# Patient Record
Sex: Male | Born: 1960 | Race: White | Hispanic: No | Marital: Married | Smoking: Current every day smoker
Health system: Southern US, Community
[De-identification: ages and names within clinical notes are randomized; demographics above are authoritative.]

## PROBLEM LIST (undated history)

## (undated) DIAGNOSIS — I10 Essential (primary) hypertension: Secondary | ICD-10-CM

## (undated) DIAGNOSIS — M109 Gout, unspecified: Secondary | ICD-10-CM

## (undated) DIAGNOSIS — E785 Hyperlipidemia, unspecified: Secondary | ICD-10-CM

## (undated) DIAGNOSIS — K469 Unspecified abdominal hernia without obstruction or gangrene: Secondary | ICD-10-CM

## (undated) DIAGNOSIS — Z720 Tobacco use: Secondary | ICD-10-CM

## (undated) DIAGNOSIS — E669 Obesity, unspecified: Secondary | ICD-10-CM

## (undated) HISTORY — DX: Obesity, unspecified: E66.9

## (undated) HISTORY — DX: Gout, unspecified: M10.9

## (undated) HISTORY — DX: Hyperlipidemia, unspecified: E78.5

## (undated) HISTORY — DX: Essential (primary) hypertension: I10

## (undated) HISTORY — DX: Tobacco use: Z72.0

## (undated) HISTORY — DX: Unspecified abdominal hernia without obstruction or gangrene: K46.9

---

## 2010-11-19 ENCOUNTER — Encounter: Payer: Self-pay | Admitting: Nurse Practitioner

## 2010-11-19 DIAGNOSIS — M109 Gout, unspecified: Secondary | ICD-10-CM | POA: Insufficient documentation

## 2010-11-19 DIAGNOSIS — K4021 Bilateral inguinal hernia, without obstruction or gangrene, recurrent: Secondary | ICD-10-CM

## 2010-11-19 DIAGNOSIS — I10 Essential (primary) hypertension: Secondary | ICD-10-CM | POA: Insufficient documentation

## 2010-11-19 DIAGNOSIS — E785 Hyperlipidemia, unspecified: Secondary | ICD-10-CM | POA: Insufficient documentation

## 2011-01-09 ENCOUNTER — Other Ambulatory Visit: Payer: Self-pay | Admitting: Family Medicine

## 2011-01-09 DIAGNOSIS — R200 Anesthesia of skin: Secondary | ICD-10-CM

## 2011-01-14 ENCOUNTER — Ambulatory Visit (HOSPITAL_COMMUNITY)
Admission: RE | Admit: 2011-01-14 | Discharge: 2011-01-14 | Disposition: A | Discharge status: Home / Self Care | Payer: PRIVATE HEALTH INSURANCE | Source: Ambulatory Visit | Attending: Family Medicine | Admitting: Family Medicine

## 2011-01-14 ENCOUNTER — Other Ambulatory Visit: Payer: Self-pay | Admitting: Family Medicine

## 2011-01-14 DIAGNOSIS — R209 Unspecified disturbances of skin sensation: Secondary | ICD-10-CM | POA: Insufficient documentation

## 2011-01-14 DIAGNOSIS — R200 Anesthesia of skin: Secondary | ICD-10-CM

## 2011-01-14 DIAGNOSIS — M542 Cervicalgia: Secondary | ICD-10-CM | POA: Insufficient documentation

## 2011-01-14 DIAGNOSIS — M5020 Other cervical disc displacement, unspecified cervical region: Secondary | ICD-10-CM | POA: Insufficient documentation

## 2011-02-23 ENCOUNTER — Ambulatory Visit: Payer: PRIVATE HEALTH INSURANCE | Attending: Orthopedic Surgery | Admitting: Physical Therapy

## 2011-02-23 DIAGNOSIS — M546 Pain in thoracic spine: Secondary | ICD-10-CM | POA: Insufficient documentation

## 2011-02-23 DIAGNOSIS — R5381 Other malaise: Secondary | ICD-10-CM | POA: Insufficient documentation

## 2011-02-23 DIAGNOSIS — IMO0001 Reserved for inherently not codable concepts without codable children: Secondary | ICD-10-CM | POA: Insufficient documentation

## 2011-02-26 ENCOUNTER — Ambulatory Visit: Payer: PRIVATE HEALTH INSURANCE | Admitting: *Deleted

## 2011-03-03 ENCOUNTER — Ambulatory Visit: Payer: PRIVATE HEALTH INSURANCE | Attending: Orthopedic Surgery | Admitting: *Deleted

## 2011-03-03 DIAGNOSIS — R5381 Other malaise: Secondary | ICD-10-CM | POA: Insufficient documentation

## 2011-03-03 DIAGNOSIS — M546 Pain in thoracic spine: Secondary | ICD-10-CM | POA: Insufficient documentation

## 2011-03-03 DIAGNOSIS — IMO0001 Reserved for inherently not codable concepts without codable children: Secondary | ICD-10-CM | POA: Insufficient documentation

## 2011-03-05 ENCOUNTER — Encounter: Payer: PRIVATE HEALTH INSURANCE | Admitting: *Deleted

## 2013-03-13 ENCOUNTER — Ambulatory Visit (INDEPENDENT_AMBULATORY_CARE_PROVIDER_SITE_OTHER): Payer: No Typology Code available for payment source | Admitting: Family Medicine

## 2013-03-13 ENCOUNTER — Encounter: Payer: Self-pay | Admitting: Family Medicine

## 2013-03-13 VITALS — BP 123/79 | HR 58 | Temp 97.2°F | Ht 67.5 in | Wt 223.0 lb

## 2013-03-13 DIAGNOSIS — F17200 Nicotine dependence, unspecified, uncomplicated: Secondary | ICD-10-CM | POA: Insufficient documentation

## 2013-03-13 DIAGNOSIS — E669 Obesity, unspecified: Secondary | ICD-10-CM | POA: Insufficient documentation

## 2013-03-13 DIAGNOSIS — K4021 Bilateral inguinal hernia, without obstruction or gangrene, recurrent: Secondary | ICD-10-CM

## 2013-03-13 DIAGNOSIS — N529 Male erectile dysfunction, unspecified: Secondary | ICD-10-CM | POA: Insufficient documentation

## 2013-03-13 DIAGNOSIS — E785 Hyperlipidemia, unspecified: Secondary | ICD-10-CM

## 2013-03-13 DIAGNOSIS — M109 Gout, unspecified: Secondary | ICD-10-CM

## 2013-03-13 DIAGNOSIS — I10 Essential (primary) hypertension: Secondary | ICD-10-CM

## 2013-03-13 LAB — POCT CBC
Granulocyte percent: 71.1 %G (ref 37–80)
HCT, POC: 47.9 % (ref 43.5–53.7)
Hemoglobin: 16.1 g/dL (ref 14.1–18.1)
Lymph, poc: 2.7 (ref 0.6–3.4)
MCH, POC: 29.8 pg (ref 27–31.2)
MCHC: 33.6 g/dL (ref 31.8–35.4)
MCV: 88.6 fL (ref 80–97)
MPV: 9.9 fL (ref 0–99.8)
POC Granulocyte: 7.7 — AB (ref 2–6.9)
POC LYMPH PERCENT: 25 %L (ref 10–50)
Platelet Count, POC: 215 10*3/uL (ref 142–424)
RBC: 5.4 M/uL (ref 4.69–6.13)
RDW, POC: 13.3 %
WBC: 10.9 10*3/uL — AB (ref 4.6–10.2)

## 2013-03-13 MED ORDER — ALLOPURINOL 300 MG PO TABS: 300.0000 mg | ORAL_TABLET | Freq: Every day | ORAL | Status: DC

## 2013-03-13 MED ORDER — LISINOPRIL 10 MG PO TABS: 10.0000 mg | ORAL_TABLET | Freq: Every day | ORAL | Status: DC

## 2013-03-13 MED ORDER — AMLODIPINE BESYLATE 2.5 MG PO TABS: 2.5000 mg | ORAL_TABLET | Freq: Every day | ORAL | Status: DC

## 2013-03-13 MED ORDER — FENOFIBRATE 160 MG PO TABS: 160.0000 mg | ORAL_TABLET | Freq: Every day | ORAL | Status: DC

## 2013-03-13 NOTE — Patient Instructions (Addendum)
Dr Woodroe Mode Recommendations  Diet and Exercise discussed with patient.  For nutrition information, I recommend books:  1).Eat to Live by Dr Monico Hoar. 2).Prevent and Reverse Heart Disease by Dr Suzzette Righter. 3) Dr Katherina Right Book:  Program to Reverse Diabetes  Exercise recommendations are:  If unable to walk, then the patient can exercise in a chair 3 times a day. By flapping arms like a bird gently and raising legs outwards to the front.  If ambulatory, the patient can go for walks for 30 minutes 3 times a week. Then increase the intensity and duration as tolerated.  Goal is to try to attain exercise frequency to 5 times a week.   Smoking Cessation Quitting smoking is important to your health and has many advantages. However, it is not always easy to quit since nicotine is a very addictive drug. Often times, people try 3 times or more before being able to quit. This document explains the best ways for you to prepare to quit smoking. Quitting takes hard work and a lot of effort, but you can do it. ADVANTAGES OF QUITTING SMOKING  You will live longer, feel better, and live better.  Your body will feel the impact of quitting smoking almost immediately.  Within 20 minutes, blood pressure decreases. Your pulse returns to its normal level.  After 8 hours, carbon monoxide levels in the blood return to normal. Your oxygen level increases.  After 24 hours, the chance of having a heart attack starts to decrease. Your breath, hair, and body stop smelling like smoke.  After 48 hours, damaged nerve endings begin to recover. Your sense of taste and smell improve.  After 72 hours, the body is virtually free of nicotine. Your bronchial tubes relax and breathing becomes easier.  After 2 to 12 weeks, lungs can hold more air. Exercise becomes easier and circulation improves.  The risk of having a heart attack, stroke, cancer, or lung disease is greatly reduced.  After 1  year, the risk of coronary heart disease is cut in half.  After 5 years, the risk of stroke falls to the same as a nonsmoker.  After 10 years, the risk of lung cancer is cut in half and the risk of other cancers decreases significantly.  After 15 years, the risk of coronary heart disease drops, usually to the level of a nonsmoker.  If you are pregnant, quitting smoking will improve your chances of having a healthy baby.  The people you live with, especially any children, will be healthier.  You will have extra money to spend on things other than cigarettes. QUESTIONS TO THINK ABOUT BEFORE ATTEMPTING TO QUIT You may want to talk about your answers with your caregiver.  Why do you want to quit?  If you tried to quit in the past, what helped and what did not?  What will be the most difficult situations for you after you quit? How will you plan to handle them?  Who can help you through the tough times? Your family? Friends? A caregiver?  What pleasures do you get from smoking? What ways can you still get pleasure if you quit? Here are some questions to ask your caregiver:  How can you help me to be successful at quitting?  What medicine do you think would be best for me and how should I take it?  What should I do if I need more help?  What is smoking withdrawal like? How can I get  information on withdrawal? GET READY  Set a quit date.  Change your environment by getting rid of all cigarettes, ashtrays, matches, and lighters in your home, car, or work. Do not let people smoke in your home.  Review your past attempts to quit. Think about what worked and what did not. GET SUPPORT AND ENCOURAGEMENT You have a better chance of being successful if you have help. You can get support in many ways.  Tell your family, friends, and co-workers that you are going to quit and need their support. Ask them not to smoke around you.  Get individual, group, or telephone counseling and support.  Programs are available at [employer] and health centers. Call your local health department for information about programs in your area.  Spiritual beliefs and practices may help some smokers quit.  Download a "quit meter" on your computer to keep track of quit statistics, such as how long you have gone without smoking, cigarettes not smoked, and money saved.  Get a self-help book about quitting smoking and staying off of tobacco. LEARN NEW SKILLS AND BEHAVIORS  Distract yourself from urges to smoke. Talk to someone, go for a walk, or occupy your time with a task.  Change your normal routine. Take a different route to work. Drink tea instead of coffee. Eat breakfast in a different place.  Reduce your stress. Take a hot bath, exercise, or read a book.  Plan something enjoyable to do every day. Reward yourself for not smoking.  Explore interactive web-based programs that specialize in helping you quit. GET MEDICINE AND USE IT CORRECTLY Medicines can help you stop smoking and decrease the urge to smoke. Combining medicine with the above behavioral methods and support can greatly increase your chances of successfully quitting smoking.  Nicotine replacement therapy helps deliver nicotine to your body without the negative effects and risks of smoking. Nicotine replacement therapy includes nicotine gum, lozenges, inhalers, nasal sprays, and skin patches. Some may be available over-the-counter and others require a prescription.  Antidepressant medicine helps people abstain from smoking, but how this works is unknown. This medicine is available by prescription.  Nicotinic receptor partial agonist medicine simulates the effect of nicotine in your brain. This medicine is available by prescription. Ask your caregiver for advice about which medicines to use and how to use them based on your health history. Your caregiver will tell you what side effects to look out for if you choose to be on a  medicine or therapy. Carefully read the information on the package. Do not use any other product containing nicotine while using a nicotine replacement product.  RELAPSE OR DIFFICULT SITUATIONS Most relapses occur within the first 3 months after quitting. Do not be discouraged if you start smoking again. Remember, most people try several times before finally quitting. You may have symptoms of withdrawal because your body is used to nicotine. You may crave cigarettes, be irritable, feel very hungry, cough often, get headaches, or have difficulty concentrating. The withdrawal symptoms are only temporary. They are strongest when you first quit, but they will go away within 10 14 days. To reduce the chances of relapse, try to:  Avoid drinking alcohol. Drinking lowers your chances of successfully quitting.  Reduce the amount of caffeine you consume. Once you quit smoking, the amount of caffeine in your body increases and can give you symptoms, such as a rapid heartbeat, sweating, and anxiety.  Avoid smokers because they can make you want to smoke.  Do not  let weight gain distract you. Many smokers will gain weight when they quit, usually less than 10 pounds. Eat a healthy diet and stay active. You can always lose the weight gained after you quit.  Find ways to improve your mood other than smoking. FOR MORE INFORMATION  www.smokefree.gov  Document Released: 07/14/2001 Document Revised: 01/19/2012 Document Reviewed: 10/29/2011 [facility] Patient Information 2014 [place], [place].  If applicable: Best to perform resistance exercises (machines or weights) 2 days a week and cardio type exercises 3 days per week.

## 2013-03-13 NOTE — Progress Notes (Signed)
Patient ID: Edwin Taylor, male   DOB: 1961-05-08, 52 y.o.   MRN: [id number] SUBJECTIVE: CC: Chief Complaint  Patient presents with  . Follow-up    6 month refill fenofibrate had pepsi this am    HPI: Patient is here for follow up of hyperlipidemia: denies Headache;denies Chest Pain;denies weakness;denies Shortness of Breath and orthopnea;denies Visual changes;denies palpitations;denies cough;denies pedal edema;denies symptoms of TIA or stroke;deniesClaudication symptoms. admits to nonCompliance with medications;because of orthostatic symptoms Problems with medications: orthostatic symptoms.  Breakfast: usually none, on Weekend: sausage  Eggs and grits Lunch: usually none if breakfast, in the week : [employer] Supper: what ever wife cooks: sphaghetti, pork chops, fried chicken.  Erections: weaker but still able to perform. Drinks 6-8 beers on either a Saturday or Sunday. Smokes 1-1 1/2 PPD since age 30 years , not ready to stop smoking.  Medications costly wants to switch to less expensive. BP medications makes him dizzy. He really takes it every other day.   Past Medical History  Diagnosis Date  . Hypertension   . Hyperlipidemia   . Gout    History reviewed. No pertinent past surgical history. History   Social History  . Marital Status: Married    Spouse Name: N/A    Number of Children: N/A  . Years of Education: N/A   Occupational History  . Not on file.   Social History Main Topics  . Smoking status: Current Every Day Smoker -- 1.50 packs/day    Types: Cigarettes  . Smokeless tobacco: Not on file  . Alcohol Use: 4.8 oz/week    8 Cans of beer per week     Comment: 8 cans of beer on the weekend Saturday when off work call.  . Drug Use: No  . Sexually Active: Yes   Other Topics Concern  . Not on file   Social History Narrative  . No narrative on file   Family History  Problem Relation Age of Onset  . Diabetes Mother   . Arthritis Mother     knees  .  Cancer Father 30  . Birth defects Sister   . Diabetes Maternal Grandmother   . Dementia Maternal Grandmother   . Heart disease Maternal Grandfather   . Heart disease Paternal Grandmother    Current Outpatient Prescriptions on File Prior to Visit  Medication Sig Dispense Refill  . pravastatin (PRAVACHOL) 40 MG tablet Take 40 mg by mouth daily.        No current facility-administered medications on file prior to visit.   No Known Allergies Immunization History  Administered Date(s) Administered  . DTaP 12/10/2008   Prior to Admission medications   Medication Sig Start Date End Date Taking? Authorizing Provider  amLODipine (NORVASC) 2.5 MG tablet Take 1 tablet (2.5 mg total) by mouth daily. 03/13/13  Yes Ileana Ladd, MD  fenofibrate 160 MG tablet Take 1 tablet (160 mg total) by mouth daily. 03/13/13  Yes Ileana Ladd, MD  lisinopril (PRINIVIL,ZESTRIL) 10 MG tablet Take 1 tablet (10 mg total) by mouth daily. 03/13/13  Yes Ileana Ladd, MD  allopurinol (ZYLOPRIM) 300 MG tablet Take 1 tablet (300 mg total) by mouth daily. 03/13/13   Ileana Ladd, MD  pravastatin (PRAVACHOL) 40 MG tablet Take 40 mg by mouth daily.     Historical Provider, MD      ROS: As above in the HPI. All other systems are stable or negative.  OBJECTIVE: APPEARANCE:  Patient in no acute distress.The patient appeared  well nourished and normally developed. Acyanotic. Waist:41 inches VITAL SIGNS:BP 123/79  Pulse 58  Temp(Src) 97.2 F (36.2 C) (Oral)  Ht 5' 7.5" (1.715 m)  Wt 223 lb (101.152 kg)  BMI 34.39 kg/m2  Obese WM  SKIN: warm and  Dry without overt rashes, tattoos and scars  HEAD and Neck: without JVD, Head and scalp: normal Eyes:No scleral icterus. Fundi normal, eye movements normal. Ears: Auricle normal, canal normal, Tympanic membranes normal, insufflation normal. Nose: normal Throat: normal. Missing front upper teeth Neck & thyroid: normal  CHEST & LUNGS: Chest wall: normal Lungs:  Clear  CVS: Reveals the PMI to be normally located. Regular rhythm, First and Second Heart sounds are normal,  absence of murmurs, rubs or gallops. Peripheral vasculature: Radial pulses: normal Dorsal pedis pulses: normal Posterior pulses: normal  ABDOMEN:  Appearance: obese Benign, no organomegaly, no masses, no Abdominal Aortic enlargement. No Guarding , no rebound. No Bruits. Bowel sounds: normal  RECTAL: N/A GU: N/A  EXTREMETIES: nonedematous. Both Femoral and Pedal pulses are normal.  MUSCULOSKELETAL:  Spine: normal Joints: intact  NEUROLOGIC: oriented to time,place and person; nonfocal. Strength is normal Sensory is normal Reflexes are normal Cranial Nerves are normal.  ASSESSMENT: HTN (hypertension) - Plan: CMP14+EGFR, amLODipine (NORVASC) 2.5 MG tablet, lisinopril (PRINIVIL,ZESTRIL) 10 MG tablet  Hyperlipemia - Plan: CMP14+EGFR, NMR, lipoprofile, fenofibrate 160 MG tablet  Gout - Plan: CMP14+EGFR, Uric acid, allopurinol (ZYLOPRIM) 300 MG tablet  Inguinal hernia recurrent bilateral - asymptomatic  Erectile dysfunction - Plan: TSH, POCT CBC  Tobacco use disorder  Obesity, unspecified  Hypotensive symptoms with his present doses  PLAN:      Dr Woodroe Mode Recommendations  Diet and Exercise discussed with patient.  For nutrition information, I recommend books:  1).Eat to Live by Dr Monico Hoar. 2).Prevent and Reverse Heart Disease by Dr Suzzette Righter. 3) Dr Katherina Right Book:  Program to Reverse Diabetes  Exercise recommendations are:  If unable to walk, then the patient can exercise in a chair 3 times a day. By flapping arms like a bird gently and raising legs outwards to the front.  If ambulatory, the patient can go for walks for 30 minutes 3 times a week. Then increase the intensity and duration as tolerated.  Goal is to try to attain exercise frequency to 5 times a week.  If applicable: Best to perform resistance exercises  (machines or weights) 2 days a week and cardio type exercises 3 days per week.  Smoking cessation handouts in the AVS. Counseled for 6 minutes.  Orders Placed This Encounter  Procedures  . CMP14+EGFR  . NMR, lipoprofile  . Uric acid  . TSH  . POCT CBC    Meds ordered this encounter  Medications  . DISCONTD: ULORIC 40 MG tablet    Sig: Take 40 mg by mouth daily.   Marland Kitchen DISCONTD: fenofibrate 160 MG tablet    Sig: Take 160 mg by mouth daily.   Marland Kitchen DISCONTD: amLODipine (NORVASC) 5 MG tablet    Sig: Take 5 mg by mouth daily.   . fenofibrate 160 MG tablet    Sig: Take 1 tablet (160 mg total) by mouth daily.    Dispense:  30 tablet    Refill:  11  . allopurinol (ZYLOPRIM) 300 MG tablet    Sig: Take 1 tablet (300 mg total) by mouth daily.    Dispense:  30 tablet    Refill:  11  . amLODipine (NORVASC) 2.5 MG tablet    Sig:  Take 1 tablet (2.5 mg total) by mouth daily.    Dispense:  30 tablet    Refill:  11  . lisinopril (PRINIVIL,ZESTRIL) 10 MG tablet    Sig: Take 1 tablet (10 mg total) by mouth daily.    Dispense:  30 tablet    Refill:  11    Return in about 3 months (around 06/13/2013) for Recheck medical problems.  Claudius Mich P. Modesto Charon, M.D.

## 2013-03-15 LAB — NMR, LIPOPROFILE
Cholesterol: 196 mg/dL (ref <200)
HDL Cholesterol by NMR: 36 mg/dL — ABNORMAL LOW (ref >=40)
HDL Particle Number: 27.1 umol/L — ABNORMAL LOW (ref >=30.5)
LDL Particle Number: 1571 nmol/L — ABNORMAL HIGH (ref <1000)
LDL Size: 21.1 nm (ref >20.5)
LDLC SERPL CALC-MCNC: 116 mg/dL — ABNORMAL HIGH (ref <100)
LP-IR Score: 68 — ABNORMAL HIGH (ref <=45)
Small LDL Particle Number: 542 nmol/L — ABNORMAL HIGH (ref <=527)
Triglycerides by NMR: 220 mg/dL — ABNORMAL HIGH (ref <150)

## 2013-03-15 LAB — CMP14+EGFR
ALT: 28 IU/L (ref 0–44)
AST: 67 IU/L — ABNORMAL HIGH (ref 0–40)
Albumin/Globulin Ratio: 2.1 (ref 1.1–2.5)
Albumin: 4.8 g/dL (ref 3.5–5.5)
Alkaline Phosphatase: 41 IU/L (ref 39–117)
BUN/Creatinine Ratio: 7 — ABNORMAL LOW (ref 9–20)
BUN: 12 mg/dL (ref 6–24)
CO2: 26 mmol/L (ref 18–29)
Calcium: 9.8 mg/dL (ref 8.7–10.2)
Chloride: 103 mmol/L (ref 97–108)
Creatinine, Ser: 1.62 mg/dL — ABNORMAL HIGH (ref 0.76–1.27)
GFR calc Af Amer: 55 mL/min/{1.73_m2} — ABNORMAL LOW (ref >59)
GFR calc non Af Amer: 48 mL/min/{1.73_m2} — ABNORMAL LOW (ref >59)
Globulin, Total: 2.3 g/dL (ref 1.5–4.5)
Glucose: 109 mg/dL — ABNORMAL HIGH (ref 65–99)
Potassium: 5.4 mmol/L — ABNORMAL HIGH (ref 3.5–5.2)
Sodium: 142 mmol/L (ref 134–144)
Total Bilirubin: 0.3 mg/dL (ref 0.0–1.2)
Total Protein: 7.1 g/dL (ref 6.0–8.5)

## 2013-03-15 LAB — URIC ACID: Uric Acid: 5.3 mg/dL (ref 3.7–8.6)

## 2013-03-15 LAB — TSH: TSH: 3.73 u[IU]/mL (ref 0.450–4.500)

## 2013-03-21 NOTE — Progress Notes (Signed)
Quick Note:  Labs abnormal. Kidney functions getting worse. Potassium mildly elevated. Lipids not at goal. Need to make an appointment with the clinicaL Pharmacist to review labs and medications and make recommendations on adjustments, dietary counseling. Thanks FW ______

## 2013-04-05 ENCOUNTER — Telehealth: Payer: Self-pay | Admitting: Pharmacist

## 2013-04-05 ENCOUNTER — Ambulatory Visit (INDEPENDENT_AMBULATORY_CARE_PROVIDER_SITE_OTHER): Payer: No Typology Code available for payment source | Admitting: Pharmacist

## 2013-04-05 VITALS — BP 120/80 | HR 68 | Ht 67.5 in | Wt 225.0 lb

## 2013-04-05 DIAGNOSIS — R799 Abnormal finding of blood chemistry, unspecified: Secondary | ICD-10-CM

## 2013-04-05 DIAGNOSIS — E785 Hyperlipidemia, unspecified: Secondary | ICD-10-CM

## 2013-04-05 DIAGNOSIS — R7989 Other specified abnormal findings of blood chemistry: Secondary | ICD-10-CM

## 2013-04-05 DIAGNOSIS — I10 Essential (primary) hypertension: Secondary | ICD-10-CM

## 2013-04-05 MED ORDER — ATORVASTATIN CALCIUM 40 MG PO TABS: 40.0000 mg | ORAL_TABLET | Freq: Every day | ORAL | Status: DC

## 2013-04-05 MED ORDER — AMLODIPINE BESYLATE 5 MG PO TABS: 5.0000 mg | ORAL_TABLET | Freq: Every day | ORAL | Status: DC

## 2013-04-06 LAB — CMP14+EGFR
ALT: 16 IU/L (ref 0–44)
Albumin/Globulin Ratio: 2.2 (ref 1.1–2.5)
GFR calc Af Amer: 57 mL/min/{1.73_m2} — ABNORMAL LOW (ref >59)
GFR calc non Af Amer: 50 mL/min/{1.73_m2} — ABNORMAL LOW (ref >59)
Globulin, Total: 2.1 g/dL (ref 1.5–4.5)
Glucose: 88 mg/dL (ref 65–99)
Potassium: 4.6 mmol/L (ref 3.5–5.2)
Total Bilirubin: 0.3 mg/dL (ref 0.0–1.2)
Total Protein: 6.8 g/dL (ref 6.0–8.5)

## 2013-04-06 NOTE — Progress Notes (Signed)
Lipid Clinic Consultation  Chief Complaint:   Chief Complaint  Patient presents with  . Hyperlipidemia  . Hypertension     Exam  edema:  negative Respirations:  normal   Carotid Bruits:  negative Xanthomas:  negative General Appearance:  alert, oriented, no acute distress and obese Mood/Affect:  normal  HPI:  First visit to lipid clinic.  Patient admits to being non compliant with cholesterol medications.  Only taking pravastatin 40mg  every 2-3 days and has not been taking fenofibrate.  He recently has elevated serum creatinine and uloric was discontinued to see if this would improve creatinine.  LABS: Serum creatinine = 1.62 GFR = 55 LDL-P = 1571 LDL-C = 116 HDL = 36 Tg = 161 K+ was 5.4 (slightly elevated) UA was WNL TSH was WNL     Component Value Date/Time   CHOL 196 03/13/2013 0912    CHD/CHF Risk Equivalents:  none  NCEP Risk Factors Present:  smoker, HTN, family history, low HDL and age Primary Problem(s):  LDL or LDL-P elevated, HDL or HDL-P decreased and TG elevated  Current NCEP Goals: LDL Goal < 100 HDL Goal >/= 40 Tg Goal < 096 Non-HDL Goal < 130  Secondary cause of hyperlipidemia present:  Poor diet and non compliance Low fat diet followed?  No - eats fast food daily at lunch Low carb diet followed?  No - regular Pepsi daily Exercise?  No -    Recommendations: 1.  Discussed diet at length - patient is not very open to change but I expressed concern of developing type 2 DM in the future and he seems open to making some changes such as limiting portion sizes and increasing vegetables.  I did strongly recommend to stop intake of soda! 2.  Discontinue pravastatin, tricor and lisinopril (may elevated creatinine)      Increase amolodipine back to 5mg  daily      Start atorvastatin 40mg  daily 3.  Encouraged daily exercise and smoking cessation 4.   Orders Placed This Encounter  Procedures  . CMP14+EGFR     Time spent counseling patient:  45  minutes

## 2013-04-10 ENCOUNTER — Telehealth: Payer: Self-pay | Admitting: Pharmacist

## 2013-04-10 NOTE — Telephone Encounter (Signed)
Serum creatinine and LFTs are still but improving.  Continue with medication changes discussed at appt last week . Recommended patient have repeat labs in 1 month but due to financial situation he would like to wait until appt in November with Dr Modesto Charon - ok to wait.

## 2013-04-10 NOTE — Telephone Encounter (Signed)
Pt notified of lab results

## 2013-04-18 ENCOUNTER — Encounter: Payer: Self-pay | Admitting: *Deleted

## 2013-06-20 ENCOUNTER — Ambulatory Visit: Payer: No Typology Code available for payment source | Admitting: Family Medicine

## 2013-06-26 ENCOUNTER — Ambulatory Visit (INDEPENDENT_AMBULATORY_CARE_PROVIDER_SITE_OTHER): Payer: No Typology Code available for payment source | Admitting: Family Medicine

## 2013-06-26 ENCOUNTER — Encounter: Payer: Self-pay | Admitting: Family Medicine

## 2013-06-26 VITALS — BP 127/77 | HR 76 | Temp 97.8°F | Ht 67.5 in | Wt 216.2 lb

## 2013-06-26 DIAGNOSIS — N529 Male erectile dysfunction, unspecified: Secondary | ICD-10-CM

## 2013-06-26 DIAGNOSIS — E785 Hyperlipidemia, unspecified: Secondary | ICD-10-CM

## 2013-06-26 DIAGNOSIS — K4021 Bilateral inguinal hernia, without obstruction or gangrene, recurrent: Secondary | ICD-10-CM

## 2013-06-26 DIAGNOSIS — M109 Gout, unspecified: Secondary | ICD-10-CM

## 2013-06-26 DIAGNOSIS — E669 Obesity, unspecified: Secondary | ICD-10-CM

## 2013-06-26 DIAGNOSIS — I10 Essential (primary) hypertension: Secondary | ICD-10-CM

## 2013-06-26 DIAGNOSIS — F17200 Nicotine dependence, unspecified, uncomplicated: Secondary | ICD-10-CM

## 2013-06-26 NOTE — Progress Notes (Signed)
  Subjective:    Patient ID: Edwin Taylor, male    DOB: 1960/08/06, 52 y.o.   MRN: [id number]  HPI 3 month follow up         Patient Active Problem List   Diagnosis Date Noted  . Tobacco use disorder 03/13/2013  . Obesity, unspecified 03/13/2013  . Erectile dysfunction 03/13/2013  . Gout 11/19/2010  . HTN (hypertension) 11/19/2010  . Hyperlipemia 11/19/2010  . Inguinal hernia recurrent bilateral 11/19/2010   Outpatient Encounter Prescriptions as of 06/26/2013  Medication Sig  . allopurinol (ZYLOPRIM) 300 MG tablet Take 1 tablet (300 mg total) by mouth daily.  Marland Kitchen amLODipine (NORVASC) 5 MG tablet Take 1 tablet (5 mg total) by mouth daily.  Marland Kitchen atorvastatin (LIPITOR) 40 MG tablet Take 1 tablet (40 mg total) by mouth daily.  Marland Kitchen lisinopril (PRINIVIL,ZESTRIL) 10 MG tablet      Review of Systems     Objective:   Physical Exam Blood pressure 127/77, pulse 76, temperature 97.8 F (36.6 C), temperature source Oral, height 5' 7.5" (1.715 m), weight 216 lb 3.2 oz (98.068 kg). s        Assessment & Plan:

## 2013-06-26 NOTE — Patient Instructions (Addendum)
Smoking Cessation Quitting smoking is important to your health and has many advantages. However, it is not always easy to quit since nicotine is a very addictive drug. Often times, people try 3 times or more before being able to quit. This document explains the best ways for you to prepare to quit smoking. Quitting takes hard work and a lot of effort, but you can do it. ADVANTAGES OF QUITTING SMOKING  You will live longer, feel better, and live better.  Your body will feel the impact of quitting smoking almost immediately.  Within 20 minutes, blood pressure decreases. Your pulse returns to its normal level.  After 8 hours, carbon monoxide levels in the blood return to normal. Your oxygen level increases.  After 24 hours, the chance of having a heart attack starts to decrease. Your breath, hair, and body stop smelling like smoke.  After 48 hours, damaged nerve endings begin to recover. Your sense of taste and smell improve.  After 72 hours, the body is virtually free of nicotine. Your bronchial tubes relax and breathing becomes easier.  After 2 to 12 weeks, lungs can hold more air. Exercise becomes easier and circulation improves.  The risk of having a heart attack, stroke, cancer, or lung disease is greatly reduced.  After 1 year, the risk of coronary heart disease is cut in half.  After 5 years, the risk of stroke falls to the same as a nonsmoker.  After 10 years, the risk of lung cancer is cut in half and the risk of other cancers decreases significantly.  After 15 years, the risk of coronary heart disease drops, usually to the level of a nonsmoker.  If you are pregnant, quitting smoking will improve your chances of having a healthy baby.  The people you live with, especially any children, will be healthier.  You will have extra money to spend on things other than cigarettes. QUESTIONS TO THINK ABOUT BEFORE ATTEMPTING TO QUIT You may want to talk about your answers with your  caregiver.  Why do you want to quit?  If you tried to quit in the past, what helped and what did not?  What will be the most difficult situations for you after you quit? How will you plan to handle them?  Who can help you through the tough times? Your family? Friends? A caregiver?  What pleasures do you get from smoking? What ways can you still get pleasure if you quit? Here are some questions to ask your caregiver:  How can you help me to be successful at quitting?  What medicine do you think would be best for me and how should I take it?  What should I do if I need more help?  What is smoking withdrawal like? How can I get information on withdrawal? GET READY  Set a quit date.  Change your environment by getting rid of all cigarettes, ashtrays, matches, and lighters in your home, car, or work. Do not let people smoke in your home.  Review your past attempts to quit. Think about what worked and what did not. GET SUPPORT AND ENCOURAGEMENT You have a better chance of being successful if you have help. You can get support in many ways.  Tell your family, friends, and co-workers that you are going to quit and need their support. Ask them not to smoke around you.  Get individual, group, or telephone counseling and support. Programs are available at local hospitals and health centers. Call your local health department for   information about programs in your area.  Spiritual beliefs and practices may help some smokers quit.  Download a "quit meter" on your computer to keep track of quit statistics, such as how long you have gone without smoking, cigarettes not smoked, and money saved.  Get a self-help book about quitting smoking and staying off of tobacco. LEARN NEW SKILLS AND BEHAVIORS  Distract yourself from urges to smoke. Talk to someone, go for a walk, or occupy your time with a task.  Change your normal routine. Take a different route to work. Drink tea instead of coffee.  Eat breakfast in a different place.  Reduce your stress. Take a hot bath, exercise, or read a book.  Plan something enjoyable to do every day. Reward yourself for not smoking.  Explore interactive web-based programs that specialize in helping you quit. GET MEDICINE AND USE IT CORRECTLY Medicines can help you stop smoking and decrease the urge to smoke. Combining medicine with the above behavioral methods and support can greatly increase your chances of successfully quitting smoking.  Nicotine replacement therapy helps deliver nicotine to your body without the negative effects and risks of smoking. Nicotine replacement therapy includes nicotine gum, lozenges, inhalers, nasal sprays, and skin patches. Some may be available over-the-counter and others require a prescription.  Antidepressant medicine helps people abstain from smoking, but how this works is unknown. This medicine is available by prescription.  Nicotinic receptor partial agonist medicine simulates the effect of nicotine in your brain. This medicine is available by prescription. Ask your caregiver for advice about which medicines to use and how to use them based on your health history. Your caregiver will tell you what side effects to look out for if you choose to be on a medicine or therapy. Carefully read the information on the package. Do not use any other product containing nicotine while using a nicotine replacement product.  RELAPSE OR DIFFICULT SITUATIONS Most relapses occur within the first 3 months after quitting. Do not be discouraged if you start smoking again. Remember, most people try several times before finally quitting. You may have symptoms of withdrawal because your body is used to nicotine. You may crave cigarettes, be irritable, feel very hungry, cough often, get headaches, or have difficulty concentrating. The withdrawal symptoms are only temporary. They are strongest when you first quit, but they will go away within  10 14 days. To reduce the chances of relapse, try to:  Avoid drinking alcohol. Drinking lowers your chances of successfully quitting.  Reduce the amount of caffeine you consume. Once you quit smoking, the amount of caffeine in your body increases and can give you symptoms, such as a rapid heartbeat, sweating, and anxiety.  Avoid smokers because they can make you want to smoke.  Do not let weight gain distract you. Many smokers will gain weight when they quit, usually less than 10 pounds. Eat a healthy diet and stay active. You can always lose the weight gained after you quit.  Find ways to improve your mood other than smoking. FOR MORE INFORMATION  www.smokefree.gov  Document Released: 07/14/2001 Document Revised: 01/19/2012 Document Reviewed: 10/29/2011 [employer] Patient Information 2014 [employer].        Dr Adalynn Corne's Recommendations  For nutrition information, I recommend books:  1).Eat to Live by Dr Joel Fuhrman. 2).Prevent and Reverse Heart Disease by Dr Caldwell Esselstyn. 3) Dr Neal Barnard's Book:  Program to Reverse Diabetes  Exercise recommendations are:  If unable to walk, then the patient can   exercise in a chair 3 times a day. By flapping arms like a bird gently and raising legs outwards to the front.  If ambulatory, the patient can go for walks for 30 minutes 3 times a week. Then increase the intensity and duration as tolerated.  Goal is to try to attain exercise frequency to 5 times a week.  If applicable: Best to perform resistance exercises (machines or weights) 2 days a week and cardio type exercises 3 days per week.   Take 81 mg aspirin daily enteric  Coated.

## 2013-06-26 NOTE — Progress Notes (Signed)
Patient ID: Edwin Taylor, male   DOB: Jan 22, 1961, 52 y.o.   MRN: [id number] SUBJECTIVE: CC: Chief Complaint  Patient presents with  . Follow-up    3 month     HPI: Edwin Taylor is here for follow up. He continues to smoke but it is less. He is reducing his sugary drinks. Patient is here for follow up of hyperlipidemia/HTN/Tobacco use: denies Headache;denies Chest Pain;denies weakness;denies Shortness of Breath and orthopnea;denies Visual changes;denies palpitations;denies cough;denies pedal edema;denies symptoms of TIA or stroke;deniesClaudication symptoms. admits to Compliance with medications; denies Problems with medications.   Past Medical History  Diagnosis Date  . Hypertension   . Hyperlipidemia   . Gout   . Obesity   . Hernia     bilateral inguinal  . Tobacco use    No past surgical history on file. History   Social History  . Marital Status: Married    Spouse Name: Edwin Taylor    Number of Children: Edwin Taylor  . Years of Education: Edwin Taylor   Occupational History  . Not on file.   Social History Main Topics  . Smoking status: Current Every Day Smoker -- 1.50 packs/day    Types: Cigarettes  . Smokeless tobacco: Not on file  . Alcohol Use: 4.8 oz/week    8 Cans of beer per week     Comment: 8 cans of beer on the weekend Saturday when off work call.  . Drug Use: No  . Sexual Activity: Yes   Other Topics Concern  . Not on file   Social History Narrative  . No narrative on file   Family History  Problem Relation Age of Onset  . Diabetes Mother   . Arthritis Mother     knees  . Cancer Father 52  . Birth defects Sister   . Diabetes Maternal Grandmother   . Dementia Maternal Grandmother   . Heart disease Maternal Grandfather   . Heart disease Paternal Grandmother    Current Outpatient Prescriptions on File Prior to Visit  Medication Sig Dispense Refill  . allopurinol (ZYLOPRIM) 300 MG tablet Take 1 tablet (300 mg total) by mouth daily.  30 tablet  11  . amLODipine  (NORVASC) 5 MG tablet Take 1 tablet (5 mg total) by mouth daily.  90 tablet  3  . atorvastatin (LIPITOR) 40 MG tablet Take 1 tablet (40 mg total) by mouth daily.  90 tablet  3   No current facility-administered medications on file prior to visit.   No Known Allergies Immunization History  Administered Date(s) Administered  . DTaP 12/10/2008   Prior to Admission medications   Medication Sig Start Date End Date Taking? Authorizing Provider  allopurinol (ZYLOPRIM) 300 MG tablet Take 1 tablet (300 mg total) by mouth daily. 03/13/13  Yes Ileana Ladd, MD  amLODipine (NORVASC) 5 MG tablet Take 1 tablet (5 mg total) by mouth daily. 04/05/13  Yes Tammy Eckard, PHARMD  atorvastatin (LIPITOR) 40 MG tablet Take 1 tablet (40 mg total) by mouth daily. 04/05/13  Yes Tammy Eckard, PHARMD  lisinopril (PRINIVIL,ZESTRIL) 10 MG tablet  06/05/13  Yes Historical Provider, MD     ROS: As above in the HPI. All other systems are stable or negative.  OBJECTIVE: APPEARANCE:  Patient in no acute distress.The patient appeared well nourished and normally developed. Acyanotic. Waist: VITAL SIGNS:BP 127/77  Pulse 76  Temp(Src) 97.8 F (36.6 C) (Oral)  Ht 5' 7.5" (1.715 m)  Wt 216 lb 3.2 oz (98.068 kg)  BMI 33.34 kg/m2  WM obese SKIN: warm and  Dry without overt rashes, tattoos and scars  HEAD and Neck: without JVD, Head and scalp: normal Eyes:No scleral icterus. Fundi normal, eye movements normal. Ears: Auricle normal, canal normal, Tympanic membranes normal, insufflation normal. Nose: normal Throat: normal Neck & thyroid: normal  CHEST & LUNGS: Chest wall: normal Lungs: Clear  CVS: Reveals the PMI to be normally located. Regular rhythm, First and Second Heart sounds are normal,  absence of murmurs, rubs or gallops. Peripheral vasculature: Radial pulses: normal Dorsal pedis pulses: normal Posterior pulses: normal  ABDOMEN:  Appearance: Obese Benign, no organomegaly, no masses, no Abdominal  Aortic enlargement. No Guarding , no rebound. No Bruits. Bowel sounds: normal  RECTAL: Edwin Taylor GU: Edwin Taylor  EXTREMETIES: nonedematous.  MUSCULOSKELETAL:  Spine: normal Joints: intact  NEUROLOGIC: oriented to time,place and person; nonfocal. Strength is normal Sensory is normal Reflexes are normal Cranial Nerves are normal.  ASSESSMENT:  HTN (hypertension) - Plan: CMP14+EGFR  Hyperlipemia - Plan: CMP14+EGFR, NMR, lipoprofile  Obesity, unspecified  Tobacco use disorder  Gout  Erectile dysfunction  Inguinal hernia recurrent bilateral  PLAN:      Dr Woodroe Mode Recommendations  For nutrition information, I recommend books:  1).Eat to Live by Dr Monico Hoar. 2).Prevent and Reverse Heart Disease by Dr Suzzette Righter. 3) Dr Katherina Right Book:  Program to Reverse Diabetes  Exercise recommendations are:  If unable to walk, then the patient can exercise in a chair 3 times a day. By flapping arms like a bird gently and raising legs outwards to the front.  If ambulatory, the patient can go for walks for 30 minutes 3 times a week. Then increase the intensity and duration as tolerated.  Goal is to try to attain exercise frequency to 5 times a week.  If applicable: Best to perform resistance exercises (machines or weights) 2 days a week and cardio type exercises 3 days per week.  Smoking cessation. Counselled. And handout in the AVS. Orders Placed This Encounter  Procedures  . CMP14+EGFR  . NMR, lipoprofile   Meds ordered this encounter  Medications  . lisinopril (PRINIVIL,ZESTRIL) 10 MG tablet    Sig:    counselled on diet and exercise and weight loss.   There are no discontinued medications. Return in about 4 months (around 10/24/2013) for Recheck medical problems.  Kaylyne Axton P. Modesto Charon, M.D.

## 2013-06-27 LAB — CMP14+EGFR
ALT: 17 IU/L (ref 0–44)
AST: 28 IU/L (ref 0–40)
Albumin/Globulin Ratio: 2.2 (ref 1.1–2.5)
Albumin: 4.8 g/dL (ref 3.5–5.5)
Alkaline Phosphatase: 88 IU/L (ref 39–117)
BUN/Creatinine Ratio: 11 (ref 9–20)
BUN: 13 mg/dL (ref 6–24)
CO2: 26 mmol/L (ref 18–29)
Calcium: 9.8 mg/dL (ref 8.7–10.2)
Chloride: 100 mmol/L (ref 97–108)
Creatinine, Ser: 1.22 mg/dL (ref 0.76–1.27)
GFR calc Af Amer: 78 mL/min/{1.73_m2} (ref >59)
GFR calc non Af Amer: 67 mL/min/{1.73_m2} (ref >59)
Globulin, Total: 2.2 g/dL (ref 1.5–4.5)
Glucose: 96 mg/dL (ref 65–99)
Potassium: 4.7 mmol/L (ref 3.5–5.2)
Sodium: 142 mmol/L (ref 134–144)
Total Bilirubin: 0.4 mg/dL (ref 0.0–1.2)
Total Protein: 7 g/dL (ref 6.0–8.5)

## 2013-06-27 LAB — NMR, LIPOPROFILE
Cholesterol: 144 mg/dL (ref <200)
HDL Cholesterol by NMR: 38 mg/dL — ABNORMAL LOW (ref >=40)
HDL Particle Number: 30.1 umol/L — ABNORMAL LOW (ref >=30.5)
LDL Particle Number: 1096 nmol/L — ABNORMAL HIGH (ref <1000)
LDL Size: 20.4 nm — ABNORMAL LOW (ref >20.5)
LDLC SERPL CALC-MCNC: 72 mg/dL (ref <100)
LP-IR Score: 58 — ABNORMAL HIGH (ref <=45)
Small LDL Particle Number: 625 nmol/L — ABNORMAL HIGH (ref <=527)
Triglycerides by NMR: 172 mg/dL — ABNORMAL HIGH (ref <150)

## 2013-06-27 LAB — SPECIMEN STATUS REPORT

## 2013-07-17 ENCOUNTER — Telehealth: Payer: Self-pay | Admitting: *Deleted

## 2013-07-20 NOTE — Telephone Encounter (Signed)
Notified and verbalized understanding.

## 2013-09-05 ENCOUNTER — Encounter: Payer: Self-pay | Admitting: Family Medicine

## 2013-09-05 ENCOUNTER — Ambulatory Visit (INDEPENDENT_AMBULATORY_CARE_PROVIDER_SITE_OTHER): Payer: No Typology Code available for payment source | Admitting: Family Medicine

## 2013-09-05 VITALS — BP 164/88 | HR 70 | Temp 98.6°F | Ht 67.5 in | Wt 222.0 lb

## 2013-09-05 DIAGNOSIS — R059 Cough, unspecified: Secondary | ICD-10-CM

## 2013-09-05 DIAGNOSIS — R05 Cough: Secondary | ICD-10-CM

## 2013-09-05 DIAGNOSIS — J029 Acute pharyngitis, unspecified: Secondary | ICD-10-CM

## 2013-09-05 DIAGNOSIS — R509 Fever, unspecified: Secondary | ICD-10-CM

## 2013-09-05 LAB — POCT RAPID STREP A (OFFICE): Rapid Strep A Screen: NEGATIVE

## 2013-09-05 LAB — POCT INFLUENZA A/B
Influenza A, POC: NEGATIVE
Influenza B, POC: NEGATIVE

## 2013-09-05 MED ORDER — AMOXICILLIN 875 MG PO TABS: 875.0000 mg | ORAL_TABLET | Freq: Two times a day (BID) | ORAL | Status: DC

## 2013-09-05 MED ORDER — HYDROCODONE-HOMATROPINE 5-1.5 MG/5ML PO SYRP
5.0000 mL | ORAL_SOLUTION | Freq: Three times a day (TID) | ORAL | Status: DC | PRN
Start: 2013-09-05 — End: 2014-08-30

## 2013-09-05 NOTE — Patient Instructions (Signed)

## 2013-09-05 NOTE — Progress Notes (Signed)
   Subjective:    Patient ID: Edwin Taylor, male    DOB: 12-25-1960, 53 y.o.   MRN: [id number]  HPI This 53 y.o. male presents for evaluation of uri and cough.  He has had this for 4 days.   Review of Systems    No chest pain, SOB, HA, dizziness, vision change, N/V, diarrhea, constipation, dysuria, urinary urgency or frequency, myalgias, arthralgias or rash.  Objective:   Physical Exam  Vital signs noted  Well developed well nourished male.  HEENT - Head atraumatic Normocephalic                Eyes - PERRLA, Conjuctiva - clear Sclera- Clear EOMI                Ears - EAC's Wnl TM's Wnl Gross Hearing WNL                Nose - Nares patent                 Throat - oropharanx wnl Respiratory - Lungs CTA bilateral Cardiac - RRR S1 and S2 without murmur GI - Abdomen soft Nontender and bowel sounds active x 4       Assessment & Plan:  Cough - Plan: POCT rapid strep A, POCT Influenza A/B, HYDROcodone-homatropine (HYCODAN) 5-1.5 MG/5ML syrup, amoxicillin (AMOXIL) 875 MG tablet  Sore throat - Plan: POCT rapid strep A, POCT Influenza A/B, HYDROcodone-homatropine (HYCODAN) 5-1.5 MG/5ML syrup, amoxicillin (AMOXIL) 875 MG tablet  Fever - Plan: POCT rapid strep A, POCT Influenza A/B, HYDROcodone-homatropine (HYCODAN) 5-1.5 MG/5ML syrup, amoxicillin (AMOXIL) 875 MG tablet  Push po fluids, rest, tylenol and motrin otc prn as directed for fever, arthralgias, and myalgias.  Follow up prn if sx's continue or persist.   Deatra CanterWilliam J Oxford FNP

## 2013-09-28 ENCOUNTER — Ambulatory Visit: Payer: No Typology Code available for payment source | Admitting: Family Medicine

## 2013-10-16 ENCOUNTER — Ambulatory Visit (INDEPENDENT_AMBULATORY_CARE_PROVIDER_SITE_OTHER): Payer: No Typology Code available for payment source | Admitting: Family Medicine

## 2013-10-16 ENCOUNTER — Encounter: Payer: Self-pay | Admitting: Family Medicine

## 2013-10-16 VITALS — BP 123/83 | HR 60 | Temp 97.5°F | Ht 67.0 in | Wt 216.0 lb

## 2013-10-16 DIAGNOSIS — E669 Obesity, unspecified: Secondary | ICD-10-CM

## 2013-10-16 DIAGNOSIS — N529 Male erectile dysfunction, unspecified: Secondary | ICD-10-CM

## 2013-10-16 DIAGNOSIS — E785 Hyperlipidemia, unspecified: Secondary | ICD-10-CM

## 2013-10-16 DIAGNOSIS — M109 Gout, unspecified: Secondary | ICD-10-CM

## 2013-10-16 DIAGNOSIS — K4021 Bilateral inguinal hernia, without obstruction or gangrene, recurrent: Secondary | ICD-10-CM

## 2013-10-16 DIAGNOSIS — F17200 Nicotine dependence, unspecified, uncomplicated: Secondary | ICD-10-CM

## 2013-10-16 DIAGNOSIS — I10 Essential (primary) hypertension: Secondary | ICD-10-CM

## 2013-10-16 NOTE — Progress Notes (Signed)
Patient ID: Edwin Taylor, male   DOB: [birth year], 53 y.o.   MRN: [id number] SUBJECTIVE: CC: Chief Complaint  Patient presents with  . Follow-up    3  MONTH FOLLOW UP CHRONIC PROBLMES    HPI: Patient is here for follow up of hyperlipidemia/HTN/tobacco user/gout: denies Headache;denies Chest Pain;denies weakness;denies Shortness of Breath and orthopnea;denies Visual changes;denies palpitations;denies cough;denies pedal edema;denies symptoms of TIA or stroke;deniesClaudication symptoms. admits to Compliance with medications; denies Problems with medications.   Past Medical History  Diagnosis Date  . Hypertension   . Hyperlipidemia   . Gout   . Obesity   . Hernia     bilateral inguinal  . Tobacco use    No past surgical history on file. History   Social History  . Marital Status: Married    Spouse Name: N/A    Number of Children: N/A  . Years of Education: N/A   Occupational History  . Not on file.   Social History Main Topics  . Smoking status: Current Every Day Smoker -- 1.50 packs/day    Types: Cigarettes  . Smokeless tobacco: Not on file  . Alcohol Use: 4.8 oz/week    8 Cans of beer per week     Comment: 8 cans of beer on the weekend Saturday when off work call.  . Drug Use: No  . Sexual Activity: Yes   Other Topics Concern  . Not on file   Social History Narrative  . No narrative on file   Family History  Problem Relation Age of Onset  . Diabetes Mother   . Arthritis Mother     knees  . Cancer Father 40  . Birth defects Sister   . Diabetes Maternal Grandmother   . Dementia Maternal Grandmother   . Heart disease Maternal Grandfather   . Heart disease Paternal Grandmother    Current Outpatient Prescriptions on File Prior to Visit  Medication Sig Dispense Refill  . allopurinol (ZYLOPRIM) 300 MG tablet Take 1 tablet (300 mg total) by mouth daily.  30 tablet  11  . amLODipine (NORVASC) 5 MG tablet Take 1 tablet (5 mg total) by mouth daily.  90 tablet   3  . atorvastatin (LIPITOR) 40 MG tablet Take 1 tablet (40 mg total) by mouth daily.  90 tablet  3  . HYDROcodone-homatropine (HYCODAN) 5-1.5 MG/5ML syrup Take 5 mLs by mouth every 8 (eight) hours as needed for cough.  120 mL  0  . lisinopril (PRINIVIL,ZESTRIL) 10 MG tablet       . amoxicillin (AMOXIL) 875 MG tablet Take 1 tablet (875 mg total) by mouth 2 (two) times daily.  20 tablet  0   No current facility-administered medications on file prior to visit.   No Known Allergies Immunization History  Administered Date(s) Administered  . DTaP 12/10/2008   Prior to Admission medications   Medication Sig Start Date End Date Taking? Authorizing Provider  allopurinol (ZYLOPRIM) 300 MG tablet Take 1 tablet (300 mg total) by mouth daily. 03/13/13   Vernie Shanks, MD  amLODipine (NORVASC) 5 MG tablet Take 1 tablet (5 mg total) by mouth daily. 04/05/13   Tammy Eckard, PHARMD  amoxicillin (AMOXIL) 875 MG tablet Take 1 tablet (875 mg total) by mouth 2 (two) times daily. 09/05/13   Lysbeth Penner, FNP  atorvastatin (LIPITOR) 40 MG tablet Take 1 tablet (40 mg total) by mouth daily. 04/05/13   Tammy Eckard, PHARMD  HYDROcodone-homatropine (HYCODAN) 5-1.5 MG/5ML syrup Take 5 mLs by  mouth every 8 (eight) hours as needed for cough. 09/05/13   Lysbeth Penner, FNP  lisinopril (PRINIVIL,ZESTRIL) 10 MG tablet  06/05/13   Historical Provider, MD     ROS: As above in the HPI. All other systems are stable or negative.  OBJECTIVE: APPEARANCE:  Patient in no acute distress.The patient appeared well nourished and normally developed. Acyanotic. Waist: VITAL SIGNS:BP 123/83  Pulse 60  Temp(Src) 97.5 F (36.4 C) (Oral)  Ht _0  (1.702 m)  Wt 216 lb (97.977 kg)  BMI 33.82 kg/m2 Obese WM  SKIN: warm and  Dry without overt rashes, tattoos and scars  HEAD and Neck: without JVD, Head and scalp: normal Eyes:No scleral icterus. Fundi normal, eye movements normal. Ears: Auricle normal, canal normal, Tympanic  membranes normal, insufflation normal. Nose: normal Throat: normal Neck & thyroid: normal  CHEST & LUNGS: Chest wall: normal Lungs: Clear  CVS: Reveals the PMI to be normally located. Regular rhythm, First and Second Heart sounds are normal,  absence of murmurs, rubs or gallops. Peripheral vasculature: Radial pulses: normal Dorsal pedis pulses: normal Posterior pulses: normal  ABDOMEN:  Appearance: normal Benign, no organomegaly, no masses, no Abdominal Aortic enlargement. No Guarding , no rebound. No Bruits. Bowel sounds: normal  RECTAL: N/A GU: N/A  EXTREMETIES: nonedematous.  MUSCULOSKELETAL:  Spine: normal Joints: intact  NEUROLOGIC: oriented to time,place and person; nonfocal. Strength is normal Sensory is normal Reflexes are normal Cranial Nerves are normal.  ASSESSMENT:  Tobacco use disorder  Obesity, unspecified  Hyperlipemia - Plan: CMP14+EGFR, Lipid panel  HTN (hypertension) - Plan: CMP14+EGFR  Gout - Plan: Uric acid  Erectile dysfunction  Inguinal hernia recurrent bilateral  PLAN:      Dr Paula Libra Recommendations  For nutrition information, I recommend books:  1).Eat to Live by Dr Excell Seltzer. 2).Prevent and Reverse Heart Disease by Dr Karl Luke. 3) Dr Janene Harvey Book:  Program to Reverse Diabetes  Exercise recommendations are:  If unable to walk, then the patient can exercise in a chair 3 times a day. By flapping arms like a bird gently and raising legs outwards to the front.  If ambulatory, the patient can go for walks for 30 minutes 3 times a week. Then increase the intensity and duration as tolerated.  Goal is to try to attain exercise frequency to 5 times a week.  If applicable: Best to perform resistance exercises (machines or weights) 2 days a week and cardio type exercises 3 days per week.  Handout on smoking cessation and counseling given for 7 minutes  Orders Placed This Encounter  Procedures  .  CMP14+EGFR  . Lipid panel  . Uric acid   No orders of the defined types were placed in this encounter.   There are no discontinued medications. Return in about 3 months (around 01/16/2014) for Recheck medical problems.  Kamin Niblack P. Jacelyn Grip, M.D.

## 2013-10-16 NOTE — Patient Instructions (Signed)
Smoking Cessation Quitting smoking is important to your health and has many advantages. However, it is not always easy to quit since nicotine is a very addictive drug. Often times, people try 3 times or more before being able to quit. This document explains the best ways for you to prepare to quit smoking. Quitting takes hard work and a lot of effort, but you can do it. ADVANTAGES OF QUITTING SMOKING  You will live longer, feel better, and live better.  Your body will feel the impact of quitting smoking almost immediately.  Within 20 minutes, blood pressure decreases. Your pulse returns to its normal level.  After 8 hours, carbon monoxide levels in the blood return to normal. Your oxygen level increases.  After 24 hours, the chance of having a heart attack starts to decrease. Your breath, hair, and body stop smelling like smoke.  After 48 hours, damaged nerve endings begin to recover. Your sense of taste and smell improve.  After 72 hours, the body is virtually free of nicotine. Your bronchial tubes relax and breathing becomes easier.  After 2 to 12 weeks, lungs can hold more air. Exercise becomes easier and circulation improves.  The risk of having a heart attack, stroke, cancer, or lung disease is greatly reduced.  After 1 year, the risk of coronary heart disease is cut in half.  After 5 years, the risk of stroke falls to the same as a nonsmoker.  After 10 years, the risk of lung cancer is cut in half and the risk of other cancers decreases significantly.  After 15 years, the risk of coronary heart disease drops, usually to the level of a nonsmoker.  If you are pregnant, quitting smoking will improve your chances of having a healthy baby.  The people you live with, especially any children, will be healthier.  You will have extra money to spend on things other than cigarettes. QUESTIONS TO THINK ABOUT BEFORE ATTEMPTING TO QUIT You may want to talk about your answers with your  caregiver.  Why do you want to quit?  If you tried to quit in the past, what helped and what did not?  What will be the most difficult situations for you after you quit? How will you plan to handle them?  Who can help you through the tough times? Your family? Friends? A caregiver?  What pleasures do you get from smoking? What ways can you still get pleasure if you quit? Here are some questions to ask your caregiver:  How can you help me to be successful at quitting?  What medicine do you think would be best for me and how should I take it?  What should I do if I need more help?  What is smoking withdrawal like? How can I get information on withdrawal? GET READY  Set a quit date.  Change your environment by getting rid of all cigarettes, ashtrays, matches, and lighters in your home, car, or work. Do not let people smoke in your home.  Review your past attempts to quit. Think about what worked and what did not. GET SUPPORT AND ENCOURAGEMENT You have a better chance of being successful if you have help. You can get support in many ways.  Tell your family, friends, and co-workers that you are going to quit and need their support. Ask them not to smoke around you.  Get individual, group, or telephone counseling and support. Programs are available at local hospitals and health centers. Call your local health department for   information about programs in your area.  Spiritual beliefs and practices may help some smokers quit.  Download a "quit meter" on your computer to keep track of quit statistics, such as how long you have gone without smoking, cigarettes not smoked, and money saved.  Get a self-help book about quitting smoking and staying off of tobacco. LEARN NEW SKILLS AND BEHAVIORS  Distract yourself from urges to smoke. Talk to someone, go for a walk, or occupy your time with a task.  Change your normal routine. Take a different route to work. Drink tea instead of coffee.  Eat breakfast in a different place.  Reduce your stress. Take a hot bath, exercise, or read a book.  Plan something enjoyable to do every day. Reward yourself for not smoking.  Explore interactive web-based programs that specialize in helping you quit. GET MEDICINE AND USE IT CORRECTLY Medicines can help you stop smoking and decrease the urge to smoke. Combining medicine with the above behavioral methods and support can greatly increase your chances of successfully quitting smoking.  Nicotine replacement therapy helps deliver nicotine to your body without the negative effects and risks of smoking. Nicotine replacement therapy includes nicotine gum, lozenges, inhalers, nasal sprays, and skin patches. Some may be available over-the-counter and others require a prescription.  Antidepressant medicine helps people abstain from smoking, but how this works is unknown. This medicine is available by prescription.  Nicotinic receptor partial agonist medicine simulates the effect of nicotine in your brain. This medicine is available by prescription. Ask your caregiver for advice about which medicines to use and how to use them based on your health history. Your caregiver will tell you what side effects to look out for if you choose to be on a medicine or therapy. Carefully read the information on the package. Do not use any other product containing nicotine while using a nicotine replacement product.  RELAPSE OR DIFFICULT SITUATIONS Most relapses occur within the first 3 months after quitting. Do not be discouraged if you start smoking again. Remember, most people try several times before finally quitting. You may have symptoms of withdrawal because your body is used to nicotine. You may crave cigarettes, be irritable, feel very hungry, cough often, get headaches, or have difficulty concentrating. The withdrawal symptoms are only temporary. They are strongest when you first quit, but they will go away within  10 14 days. To reduce the chances of relapse, try to:  Avoid drinking alcohol. Drinking lowers your chances of successfully quitting.  Reduce the amount of caffeine you consume. Once you quit smoking, the amount of caffeine in your body increases and can give you symptoms, such as a rapid heartbeat, sweating, and anxiety.  Avoid smokers because they can make you want to smoke.  Do not let weight gain distract you. Many smokers will gain weight when they quit, usually less than 10 pounds. Eat a healthy diet and stay active. You can always lose the weight gained after you quit.  Find ways to improve your mood other than smoking. FOR MORE INFORMATION  www.smokefree.gov  Document Released: 07/14/2001 Document Revised: 01/19/2012 Document Reviewed: 10/29/2011 ExitCare Patient Information 2014 [employer].        Dr Leata Dominy's Recommendations  For nutrition information, I recommend books:  1).Eat to Live by Dr Joel Fuhrman. 2).Prevent and Reverse Heart Disease by Dr Caldwell Esselstyn. 3) Dr Neal Barnard's Book:  Program to Reverse Diabetes  Exercise recommendations are:  If unable to walk, then the patient can   exercise in a chair 3 times a day. By flapping arms like a bird gently and raising legs outwards to the front.  If ambulatory, the patient can go for walks for 30 minutes 3 times a week. Then increase the intensity and duration as tolerated.  Goal is to try to attain exercise frequency to 5 times a week.  If applicable: Best to perform resistance exercises (machines or weights) 2 days a week and cardio type exercises 3 days per week.  

## 2013-10-17 LAB — CMP14+EGFR
ALT: 14 IU/L (ref 0–44)
AST: 14 IU/L (ref 0–40)
Albumin/Globulin Ratio: 2 (ref 1.1–2.5)
Albumin: 4.5 g/dL (ref 3.5–5.5)
Alkaline Phosphatase: 80 IU/L (ref 39–117)
BUN/Creatinine Ratio: 7 — ABNORMAL LOW (ref 9–20)
BUN: 9 mg/dL (ref 6–24)
CO2: 22 mmol/L (ref 18–29)
Calcium: 9.2 mg/dL (ref 8.7–10.2)
Chloride: 103 mmol/L (ref 97–108)
Creatinine, Ser: 1.31 mg/dL — ABNORMAL HIGH (ref 0.76–1.27)
GFR calc Af Amer: 71 mL/min/{1.73_m2} (ref >59)
GFR calc non Af Amer: 62 mL/min/{1.73_m2} (ref >59)
Globulin, Total: 2.2 g/dL (ref 1.5–4.5)
Glucose: 112 mg/dL — ABNORMAL HIGH (ref 65–99)
Potassium: 4.6 mmol/L (ref 3.5–5.2)
Sodium: 143 mmol/L (ref 134–144)
Total Bilirubin: 0.3 mg/dL (ref 0.0–1.2)
Total Protein: 6.7 g/dL (ref 6.0–8.5)

## 2013-10-17 LAB — URIC ACID: Uric Acid: 10.6 mg/dL — ABNORMAL HIGH (ref 3.7–8.6)

## 2013-10-17 LAB — LIPID PANEL
Chol/HDL Ratio: 5.7 ratio units — ABNORMAL HIGH (ref 0.0–5.0)
Cholesterol, Total: 205 mg/dL — ABNORMAL HIGH (ref 100–199)
HDL: 36 mg/dL — ABNORMAL LOW (ref >39)
LDL Calculated: 122 mg/dL — ABNORMAL HIGH (ref 0–99)
Triglycerides: 236 mg/dL — ABNORMAL HIGH (ref 0–149)
VLDL Cholesterol Cal: 47 mg/dL — ABNORMAL HIGH (ref 5–40)

## 2013-10-17 NOTE — Progress Notes (Signed)
Quick Note:  Call Patient Labs that are abnormal: Uric acid too high Lipids have gone back up  Recommendations: I suspect that he got off his medications. If he did , then he needs to get back on them. If not then he should let us know and we will need to adjust medications.   ______

## 2014-04-03 ENCOUNTER — Other Ambulatory Visit: Payer: Self-pay | Admitting: *Deleted

## 2014-04-03 MED ORDER — ALLOPURINOL 300 MG PO TABS: 300.0000 mg | ORAL_TABLET | Freq: Every day | ORAL | Status: DC

## 2014-04-20 ENCOUNTER — Other Ambulatory Visit: Payer: Self-pay | Admitting: *Deleted

## 2014-04-20 MED ORDER — ATORVASTATIN CALCIUM 40 MG PO TABS: 40.0000 mg | ORAL_TABLET | Freq: Every day | ORAL | Status: DC

## 2014-04-24 ENCOUNTER — Other Ambulatory Visit: Payer: Self-pay | Admitting: *Deleted

## 2014-04-24 MED ORDER — AMLODIPINE BESYLATE 5 MG PO TABS: 5.0000 mg | ORAL_TABLET | Freq: Every day | ORAL | Status: DC

## 2014-06-20 ENCOUNTER — Other Ambulatory Visit: Payer: Self-pay | Admitting: Family Medicine

## 2014-06-21 ENCOUNTER — Telehealth: Payer: Self-pay | Admitting: Family Medicine

## 2014-06-22 ENCOUNTER — Other Ambulatory Visit: Payer: Self-pay | Admitting: *Deleted

## 2014-06-22 MED ORDER — COLCHICINE 0.6 MG PO TABS: 0.6000 mg | ORAL_TABLET | Freq: Every day | ORAL | Status: DC

## 2014-06-22 NOTE — Telephone Encounter (Signed)
rx for colcrys sent to provider to be filled from pharmacy

## 2014-08-08 ENCOUNTER — Other Ambulatory Visit: Payer: Self-pay | Admitting: Family Medicine

## 2014-08-09 ENCOUNTER — Telehealth: Payer: Self-pay | Admitting: Family Medicine

## 2014-08-10 ENCOUNTER — Other Ambulatory Visit: Payer: Self-pay | Admitting: *Deleted

## 2014-08-10 MED ORDER — ALLOPURINOL 300 MG PO TABS: 300.0000 mg | ORAL_TABLET | Freq: Every day | ORAL | Status: DC

## 2014-08-10 NOTE — Progress Notes (Signed)
Rx sent into [facility] for 1 refill until appt

## 2014-08-10 NOTE — Telephone Encounter (Signed)
[facility]

## 2014-08-30 ENCOUNTER — Ambulatory Visit (INDEPENDENT_AMBULATORY_CARE_PROVIDER_SITE_OTHER): Payer: No Typology Code available for payment source | Admitting: Family Medicine

## 2014-08-30 ENCOUNTER — Encounter: Payer: Self-pay | Admitting: Family Medicine

## 2014-08-30 VITALS — BP 140/75 | HR 78 | Temp 97.2°F | Ht 67.0 in | Wt 222.0 lb

## 2014-08-30 DIAGNOSIS — E785 Hyperlipidemia, unspecified: Secondary | ICD-10-CM

## 2014-08-30 DIAGNOSIS — M1A09X0 Idiopathic chronic gout, multiple sites, without tophus (tophi): Secondary | ICD-10-CM

## 2014-08-30 DIAGNOSIS — N529 Male erectile dysfunction, unspecified: Secondary | ICD-10-CM

## 2014-08-30 DIAGNOSIS — I10 Essential (primary) hypertension: Secondary | ICD-10-CM

## 2014-08-30 MED ORDER — LISINOPRIL 10 MG PO TABS: 10.0000 mg | ORAL_TABLET | Freq: Every day | ORAL | Status: DC

## 2014-08-30 MED ORDER — ALLOPURINOL 300 MG PO TABS: 300.0000 mg | ORAL_TABLET | Freq: Every day | ORAL | Status: DC

## 2014-08-30 MED ORDER — COLCHICINE 0.6 MG PO TABS: 0.6000 mg | ORAL_TABLET | Freq: Every day | ORAL | Status: DC

## 2014-08-30 MED ORDER — ATORVASTATIN CALCIUM 40 MG PO TABS: 40.0000 mg | ORAL_TABLET | Freq: Every day | ORAL | Status: DC

## 2014-08-30 MED ORDER — AMLODIPINE BESYLATE 5 MG PO TABS: 5.0000 mg | ORAL_TABLET | Freq: Every day | ORAL | Status: DC

## 2014-08-30 NOTE — Progress Notes (Signed)
Subjective:    Patient ID: Edwin Taylor, male    DOB: 04/05/1961, 54 y.o.   MRN: [id number]  HPI Patient in for follow-up of hypertension. Patient has no history of headache chest pain or shortness of breath or recent cough. Patient also denies symptoms of TIA such as numbness weakness lateralizing. Patient does not check  blood pressure at home Patient denies side effects from his medication. States taking it intermittently. He is trying to avoid white food, but not regular. He drinks 3 Pepsis a day - down from 12.   Hates water. Tea flares his gout.  Drinking soda instead. No recent flares. TAking allopurinol irregularly, Has colchicine and voices understanding of proper use when necessary.  Patient in for follow-up of elevated cholesterol. Doing well without complaints on current medication except that he hates to take it and misses it about half the time.. Denies side effects of statin including myalgia and arthralgia and nausea. Also in today for liver function testing. Currently no chest pain, shortness of breath or other cardiovascular related symptoms noted.    Review of Systems  Constitutional: Negative for fever, chills, diaphoresis and unexpected weight change.  HENT: Negative for congestion, hearing loss, rhinorrhea, sore throat and trouble swallowing.   Respiratory: Negative for cough, chest tightness, shortness of breath and wheezing.   Gastrointestinal: Negative for nausea, vomiting, abdominal pain, diarrhea, constipation and abdominal distention.  Endocrine: Negative for cold intolerance and heat intolerance.  Genitourinary: Positive for penile swelling. Negative for dysuria, hematuria and flank pain.       C/O erectile dysfunction, poor energy, loss of libido and abd weight gain.  Musculoskeletal: Negative for joint swelling and arthralgias.       Joints in feet pop in the morning when he first gets up.  Skin: Negative for rash.  Neurological: Negative for dizziness and  headaches.  Psychiatric/Behavioral: Negative for dysphoric mood, decreased concentration and agitation. The patient is not nervous/anxious.        Objective:   Physical Exam  Constitutional: He is oriented to person, place, and time. He appears well-developed and well-nourished. No distress.  HENT:  Head: Normocephalic and atraumatic.  Right Ear: External ear normal.  Left Ear: External ear normal.  Nose: Nose normal.  Mouth/Throat: Oropharynx is clear and moist.  Eyes: Conjunctivae and EOM are normal. Pupils are equal, round, and reactive to light.  Neck: Normal range of motion. Neck supple. No thyromegaly present.  Cardiovascular: Normal rate, regular rhythm and normal heart sounds.   No murmur heard. Pulmonary/Chest: Effort normal and breath sounds normal. No respiratory distress. He has no wheezes. He has no rales.  Abdominal: Soft. Bowel sounds are normal. He exhibits no distension. There is no tenderness.  Lymphadenopathy:    He has no cervical adenopathy.  Neurological: He is alert and oriented to person, place, and time. He has normal reflexes.  Skin: Skin is warm and dry.  Psychiatric: He has a normal mood and affect. His behavior is normal. Judgment and thought content normal.          Assessment & Plan:   1. Hyperlipemia   2. Essential hypertension   3. 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 allopurinol (ZYLOPRIM) 300 MG tablet    Sig: Take 1 tablet (300 mg total) by mouth daily.    Dispense:  30 tablet  Refill:  5  . amLODipine (NORVASC) 5 MG tablet    Sig: Take 1 tablet (5 mg total) by mouth daily.    Dispense:  30 tablet    Refill:  5  . atorvastatin (LIPITOR) 40 MG tablet    Sig: Take 1 tablet (40 mg total) by mouth daily.    Dispense:  90 tablet    Refill:  1  . colchicine 0.6 MG tablet    Sig: Take 1 tablet (0.6 mg  total) by mouth daily.    Dispense:  10 tablet    Refill:  2  . lisinopril (PRINIVIL,ZESTRIL) 10 MG tablet    Sig: Take 1 tablet (10 mg total) by mouth daily.    Dispense:  30 tablet    Refill:  5    Orders Placed This Encounter  Procedures  . CMP14+EGFR  . Testosterone,Free and Total  . TSH  . Lipid panel  . Uric acid    Labs pending Health Maintenance reviewed Dash diet and exercise encouraged Continue all meds as discussed Follow up in 6 mos.  Claretta Fraise, MD

## 2014-08-30 NOTE — Progress Notes (Signed)
   Subjective:    Patient ID: Edwin Taylor, male    DOB: 05/14/1961, 54 y.o.   MRN: [id number]  HPI Patient is here today for a follow up of chronic medical problems which include hypertension, hyperlipidemia and gout.         No Known Allergies  Outpatient Encounter Prescriptions as of 08/30/2014  Medication Sig  . allopurinol (ZYLOPRIM) 300 MG tablet Take 1 tablet (300 mg total) by mouth daily.  Marland Kitchen. amLODipine (NORVASC) 5 MG tablet Take 1 tablet (5 mg total) by mouth daily.  Marland Kitchen. atorvastatin (LIPITOR) 40 MG tablet Take 1 tablet (40 mg total) by mouth daily.  . colchicine 0.6 MG tablet Take 1 tablet (0.6 mg total) by mouth daily.  Marland Kitchen. lisinopril (PRINIVIL,ZESTRIL) 10 MG tablet   . [DISCONTINUED] amoxicillin (AMOXIL) 875 MG tablet Take 1 tablet (875 mg total) by mouth 2 (two) times daily.  . [DISCONTINUED] HYDROcodone-homatropine (HYCODAN) 5-1.5 MG/5ML syrup Take 5 mLs by mouth every 8 (eight) hours as needed for cough.    Past Medical History  Diagnosis Date  . Hypertension   . Hyperlipidemia   . Gout   . Obesity   . Hernia     bilateral inguinal  . Tobacco use     No past surgical history on file.  History   Social History  . Marital Status: Married    Spouse Name: N/A    Number of Children: N/A  . Years of Education: N/A   Occupational History  . Not on file.   Social History Main Topics  . Smoking status: Current Every Day Smoker -- 2.00 packs/day    Types: Cigarettes  . Smokeless tobacco: Not on file  . Alcohol Use: 4.8 oz/week    8 Cans of beer per week     Comment: 8 cans of beer on the weekend Saturday when off work call.  . Drug Use: No  . Sexual Activity: Yes   Other Topics Concern  . Not on file   Social History Narrative     Review of Systems     Objective:   Physical Exam BP 140/75 mmHg  Pulse 78  Temp(Src) 97.2 F (36.2 C) (Oral)  Ht 5\' 7"  (1.702 m)  Wt 222 lb (100.699 kg)  BMI 34.76 kg/m2        Assessment & Plan:

## 2014-08-30 NOTE — Patient Instructions (Signed)
DASH Eating Plan °DASH stands for "Dietary Approaches to Stop Hypertension." The DASH eating plan is a healthy eating plan that has been shown to reduce high blood pressure (hypertension). Additional health benefits may include reducing the risk of type 2 diabetes mellitus, heart disease, and stroke. The DASH eating plan may also help with weight loss. °WHAT DO I NEED TO KNOW ABOUT THE DASH EATING PLAN? °For the DASH eating plan, you will follow these general guidelines: °· Choose foods with a percent daily value for sodium of less than 5% (as listed on the food label). °· Use salt-free seasonings or herbs instead of table salt or sea salt. °· Check with your health care provider or pharmacist before using salt substitutes. °· Eat lower-sodium products, often labeled as "lower sodium" or "no salt added." °· Eat fresh foods. °· Eat more vegetables, fruits, and low-fat dairy products. °· Choose whole grains. Look for the word "whole" as the first word in the ingredient list. °· Choose fish and skinless chicken or turkey more often than red meat. Limit fish, poultry, and meat to 6 oz (170 g) each day. °· Limit sweets, desserts, sugars, and sugary drinks. °· Choose heart-healthy fats. °· Limit cheese to 1 oz (28 g) per day. °· Eat more home-cooked food and less restaurant, buffet, and fast food. °· Limit fried foods. °· Cook foods using methods other than frying. °· Limit canned vegetables. If you do use them, rinse them well to decrease the sodium. °· When eating at a restaurant, ask that your food be prepared with less salt, or no salt if possible. °WHAT FOODS CAN I EAT? °Seek help from a dietitian for individual calorie needs. °Grains °Whole grain or whole wheat bread. Brown rice. Whole grain or whole wheat pasta. Quinoa, bulgur, and whole grain cereals. Low-sodium cereals. Corn or whole wheat flour tortillas. Whole grain cornbread. Whole grain crackers. Low-sodium crackers. °Vegetables °Fresh or frozen vegetables  (raw, steamed, roasted, or grilled). Low-sodium or reduced-sodium tomato and vegetable juices. Low-sodium or reduced-sodium tomato sauce and paste. Low-sodium or reduced-sodium canned vegetables.  °Fruits °All fresh, canned (in natural juice), or frozen fruits. °Meat and Other Protein Products °Ground beef (85% or leaner), grass-fed beef, or beef trimmed of fat. Skinless chicken or turkey. Ground chicken or turkey. Pork trimmed of fat. All fish and seafood. Eggs. Dried beans, peas, or lentils. Unsalted nuts and seeds. Unsalted canned beans. °Dairy °Low-fat dairy products, such as skim or 1% milk, 2% or reduced-fat cheeses, low-fat ricotta or cottage cheese, or plain low-fat yogurt. Low-sodium or reduced-sodium cheeses. °Fats and Oils °Tub margarines without trans fats. Light or reduced-fat mayonnaise and salad dressings (reduced sodium). Avocado. Safflower, olive, or canola oils. Natural peanut or almond butter. °Other °Unsalted popcorn and pretzels. °The items listed above may not be a complete list of recommended foods or beverages. Contact your dietitian for more options. °WHAT FOODS ARE NOT RECOMMENDED? °Grains °White bread. White pasta. White rice. Refined cornbread. Bagels and croissants. Crackers that contain trans fat. °Vegetables °Creamed or fried vegetables. Vegetables in a cheese sauce. Regular canned vegetables. Regular canned tomato sauce and paste. Regular tomato and vegetable juices. °Fruits °Dried fruits. Canned fruit in light or heavy syrup. Fruit juice. °Meat and Other Protein Products °Fatty cuts of meat. Ribs, chicken wings, bacon, sausage, bologna, salami, chitterlings, fatback, hot dogs, bratwurst, and packaged luncheon meats. Salted nuts and seeds. Canned beans with salt. °Dairy °Whole or 2% milk, cream, half-and-half, and cream cheese. Whole-fat or sweetened yogurt. Full-fat   cheeses or blue cheese. Nondairy creamers and whipped toppings. Processed cheese, cheese spreads, or cheese  curds. °Condiments °Onion and garlic salt, seasoned salt, table salt, and sea salt. Canned and packaged gravies. Worcestershire sauce. Tartar sauce. Barbecue sauce. Teriyaki sauce. Soy sauce, including reduced sodium. Steak sauce. Fish sauce. Oyster sauce. Cocktail sauce. Horseradish. Ketchup and mustard. Meat flavorings and tenderizers. Bouillon cubes. Hot sauce. Tabasco sauce. Marinades. Taco seasonings. Relishes. °Fats and Oils °Butter, stick margarine, lard, shortening, ghee, and bacon fat. Coconut, palm kernel, or palm oils. Regular salad dressings. °Other °Pickles and olives. Salted popcorn and pretzels. °The items listed above may not be a complete list of foods and beverages to avoid. Contact your dietitian for more information. °WHERE CAN I FIND MORE INFORMATION? °National Heart, Lung, and Blood Institute: www.nhlbi.nih.gov/health/health-topics/topics/dash/ °Document Released: 07/09/2011 Document Revised: 12/04/2013 Document Reviewed: 05/24/2013 °ExitCare® Patient Information ©2015 [employer]. This information is not intended to replace advice given to you by your health care provider. Make sure you discuss any questions you have with your health care provider. ° °

## 2014-09-01 LAB — CMP14+EGFR
ALBUMIN: 4.7 g/dL (ref 3.5–5.5)
ALT: 11 IU/L (ref 0–44)
AST: 16 IU/L (ref 0–40)
Albumin/Globulin Ratio: 1.8 (ref 1.1–2.5)
Alkaline Phosphatase: 97 IU/L (ref 39–117)
BUN/Creatinine Ratio: 10 (ref 9–20)
BUN: 12 mg/dL (ref 6–24)
CHLORIDE: 100 mmol/L (ref 97–108)
CO2: 25 mmol/L (ref 18–29)
CREATININE: 1.24 mg/dL (ref 0.76–1.27)
Calcium: 9.6 mg/dL (ref 8.7–10.2)
GFR calc non Af Amer: 65 mL/min/{1.73_m2} (ref >59)
GFR, EST AFRICAN AMERICAN: 76 mL/min/{1.73_m2} (ref >59)
Globulin, Total: 2.6 g/dL (ref 1.5–4.5)
Glucose: 120 mg/dL — ABNORMAL HIGH (ref 65–99)
POTASSIUM: 5.2 mmol/L (ref 3.5–5.2)
SODIUM: 140 mmol/L (ref 134–144)
Total Bilirubin: 0.6 mg/dL (ref 0.0–1.2)
Total Protein: 7.3 g/dL (ref 6.0–8.5)

## 2014-09-01 LAB — LIPID PANEL
CHOL/HDL RATIO: 3.8 ratio (ref 0.0–5.0)
Cholesterol, Total: 139 mg/dL (ref 100–199)
HDL: 37 mg/dL — AB (ref >39)
LDL Calculated: 61 mg/dL (ref 0–99)
TRIGLYCERIDES: 203 mg/dL — AB (ref 0–149)
VLDL CHOLESTEROL CAL: 41 mg/dL — AB (ref 5–40)

## 2014-09-01 LAB — TSH: TSH: 4.16 u[IU]/mL (ref 0.450–4.500)

## 2014-09-01 LAB — TESTOSTERONE,FREE AND TOTAL
TESTOSTERONE: 301 ng/dL — AB (ref 348–1197)
Testosterone, Free: 6 pg/mL — ABNORMAL LOW (ref 7.2–24.0)

## 2014-09-01 LAB — URIC ACID: Uric Acid: 7.5 mg/dL (ref 3.7–8.6)

## 2014-09-04 ENCOUNTER — Other Ambulatory Visit: Payer: Self-pay | Admitting: Family Medicine

## 2014-09-04 DIAGNOSIS — E291 Testicular hypofunction: Secondary | ICD-10-CM

## 2014-09-04 MED ORDER — ANDROGEL 20.25 MG/1.25GM (1.62%) TD GEL: TRANSDERMAL | Status: DC

## 2014-09-10 ENCOUNTER — Telehealth: Payer: Self-pay | Admitting: Family Medicine

## 2014-09-10 NOTE — Telephone Encounter (Signed)
Prescription originally printed. I have called this into the pharmacy.

## 2014-09-11 ENCOUNTER — Other Ambulatory Visit: Payer: Self-pay | Admitting: Family Medicine

## 2014-09-13 ENCOUNTER — Other Ambulatory Visit: Payer: Self-pay | Admitting: Family Medicine

## 2014-09-13 MED ORDER — TESTOSTERONE CYPIONATE 200 MG/ML IM SOLN: 200.0000 mg | INTRAMUSCULAR | Status: DC

## 2014-09-13 NOTE — Telephone Encounter (Signed)
I went ahead with a prescription for testosterone injection. He should pick that up at the pharmacy and bring it with him in for an injection here once every 2 weeks. It is the cheapest thing available for testosterone. There is nothing less expensive and a topical preparation.

## 2014-09-14 NOTE — Telephone Encounter (Signed)
Left message to return call 

## 2014-09-18 ENCOUNTER — Telehealth: Payer: Self-pay | Admitting: Family Medicine

## 2014-09-19 NOTE — Telephone Encounter (Signed)
Patient states that testosterone is 550.00 after insurance and he cannot afford. Please advise. Is there any alternative?

## 2014-09-19 NOTE — Telephone Encounter (Signed)
Tell him that the medication may be much cheaper if he goes to [facility]. However, the injection is the cheapest available on the market.

## 2014-09-28 NOTE — Telephone Encounter (Signed)
[facility] and [facility]

## 2014-10-18 ENCOUNTER — Telehealth: Payer: Self-pay

## 2014-10-18 DIAGNOSIS — E349 Endocrine disorder, unspecified: Secondary | ICD-10-CM

## 2014-10-18 NOTE — Telephone Encounter (Signed)
Androgel needs prior authorization and 2 testosterone levels are needed to get that authorization   We only have one

## 2014-10-19 NOTE — Telephone Encounter (Signed)
Patient needs to have repeat testosterone level before 10am so we can get androgel approved. Left message for patient to call us back.

## 2014-10-19 NOTE — Telephone Encounter (Signed)
Please order

## 2014-10-19 NOTE — Telephone Encounter (Signed)
Patient aware.

## 2014-12-13 ENCOUNTER — Encounter: Payer: Self-pay | Admitting: Physician Assistant

## 2014-12-13 ENCOUNTER — Ambulatory Visit (INDEPENDENT_AMBULATORY_CARE_PROVIDER_SITE_OTHER): Payer: No Typology Code available for payment source | Admitting: Physician Assistant

## 2014-12-13 VITALS — BP 116/75 | HR 75 | Temp 97.1°F | Ht 67.0 in | Wt 222.0 lb

## 2014-12-13 DIAGNOSIS — R6883 Chills (without fever): Secondary | ICD-10-CM

## 2014-12-13 DIAGNOSIS — J020 Streptococcal pharyngitis: Secondary | ICD-10-CM

## 2014-12-13 DIAGNOSIS — J209 Acute bronchitis, unspecified: Secondary | ICD-10-CM

## 2014-12-13 LAB — POCT INFLUENZA A/B
Influenza A, POC: NEGATIVE
Influenza B, POC: NEGATIVE

## 2014-12-13 LAB — POCT RAPID STREP A (OFFICE): RAPID STREP A SCREEN: NEGATIVE

## 2014-12-13 MED ORDER — AZITHROMYCIN 250 MG PO TABS: ORAL_TABLET | ORAL | Status: DC

## 2014-12-13 MED ORDER — ALBUTEROL SULFATE HFA 108 (90 BASE) MCG/ACT IN AERS: 2.0000 | INHALATION_SPRAY | Freq: Four times a day (QID) | RESPIRATORY_TRACT | Status: DC | PRN

## 2014-12-13 NOTE — Patient Instructions (Signed)
-   Over the counter plain musinex with plenty of water - Tylenol or motrin for pain, fever - cool mist humidifier.    Acute Bronchitis Bronchitis is when the airways that extend from the windpipe into the lungs get red, puffy, and painful (inflamed). Bronchitis often causes thick spit (mucus) to develop. This leads to a cough. A cough is the most common symptom of bronchitis. In acute bronchitis, the condition usually begins suddenly and goes away over time (usually in 2 weeks). Smoking, allergies, and asthma can make bronchitis worse. Repeated episodes of bronchitis may cause more lung problems. HOME CARE  Rest.  Drink enough fluids to keep your pee (urine) clear or pale yellow (unless you need to limit fluids as told by your doctor).  Only take over-the-counter or prescription medicines as told by your doctor.  Avoid smoking and secondhand smoke. These can make bronchitis worse. If you are a smoker, think about using nicotine gum or skin patches. Quitting smoking will help your lungs heal faster.  Reduce the chance of getting bronchitis again by:  Washing your hands often.  Avoiding people with cold symptoms.  Trying not to touch your hands to your mouth, nose, or eyes.  Follow up with your doctor as told. GET HELP IF: Your symptoms do not improve after 1 week of treatment. Symptoms include:  Cough.  Fever.  Coughing up thick spit.  Body aches.  Chest congestion.  Chills.  Shortness of breath.  Sore throat. GET HELP RIGHT AWAY IF:   You have an increased fever.  You have chills.  You have severe shortness of breath.  You have bloody thick spit (sputum).  You throw up (vomit) often.  You lose too much body fluid (dehydration).  You have a severe headache.  You faint. MAKE SURE YOU:   Understand these instructions.  Will watch your condition.  Will get help right away if you are not doing well or get worse. Document Released: 01/06/2008 Document  Revised: 03/22/2013 Document Reviewed: 01/10/2013 [facility] Patient Information 2015 [place]. This information is not intended to replace advice given to you by your health care provider. Make sure you discuss any questions you have with your health care provider.

## 2014-12-13 NOTE — Progress Notes (Signed)
   Subjective:    Patient ID: Edwin Taylor, male    DOB: [birth year], 54 y.o.   MRN: [id number]  HPI 54 y/o male presents with c/o sore throat x 6 days ago,  With productive cough. Nausea x 4 days. Has vomiting twice. Has tried ASA, Dayquil and Nyquil consistent x 5 days with no relief.     Review of Systems  Constitutional: Positive for chills, diaphoresis and fatigue.  HENT: Positive for congestion (chest ) and sore throat. Negative for ear pain, postnasal drip, rhinorrhea and sinus pressure.   Respiratory: Positive for cough (productive ) and wheezing. Negative for shortness of breath.   Cardiovascular: Negative.   Gastrointestinal: Positive for nausea.  Neurological: Positive for headaches.       Objective:   Physical Exam  Constitutional: He appears well-developed and well-nourished. No distress.  HENT:  Head: Normocephalic.  Right Ear: External ear normal.  Left Ear: External ear normal.  Mouth/Throat: Oropharynx is clear and moist. No oropharyngeal exudate.  Cardiovascular: Normal rate, regular rhythm and normal heart sounds.  Exam reveals no gallop and no friction rub.   No murmur heard. Pulmonary/Chest: Effort normal. He has wheezes (RUL posterior ). He has no rales.  Skin: He is not diaphoretic.  Psychiatric: His behavior is normal. Judgment and thought content normal.  Nursing note and vitals reviewed.         Assessment & Plan:  1. Streptococcal sore throat  - POCT rapid strep A negative  - Culture, Group A Strep  2. Chills  - POCT Influenza A/B negative   3. Acute bronchitis, unspecified organism  - albuterol (PROVENTIL HFA;VENTOLIN HFA) 108 (90 BASE) MCG/ACT inhaler; Inhale 2 puffs into the lungs every 6 (six) hours as needed for wheezing or shortness of breath.  Dispense: 1 Inhaler; Refill: 0 - azithromycin (ZITHROMAX) 250 MG tablet; Two pills on day 1, then 1 pill on day 2-5  Dispense: 6 tablet; Refill: 0 - OTC plain musinex as directed.     Continue all meds Labs pending Health Maintenance reviewed Diet and exercise encouraged RTO prn   Edwin Taylor ReadingGann PA-C

## 2014-12-16 LAB — CULTURE, GROUP A STREP: Strep A Culture: NEGATIVE

## 2015-02-28 NOTE — Progress Notes (Signed)
Subjective:  Patient ID: Edwin Taylor, male    DOB: Aug 10, 1960  Age: 54 y.o. MRN: [id number]  CC: Hyperlipidemia; Hypertension; Gout; and Medication Refill   HPI Edwin Taylor presents for  follow-up of hypertension. Patient has no history of headache chest pain or shortness of breath or recent cough. Patient also denies symptoms of TIA such as numbness weakness lateralizing.  Patient denies side effects from his medication. States taking it most days but hates pills. Forgets occasionally.  Patient also  in for follow-up of elevated cholesterol. Doing well without complaints on current medication. Denies side effects of statin including myalgia and arthralgia and nausea. Also in today for liver function testing. Currently no chest pain, shortness of breath or other cardiovascular related symptoms noted.  No recent gout attack. Off meds. Flares around Thanksgiving.   Refuses to consider tobacco cessation, diet, exercise or even voicing compliance with meds. "My grandparents took all kinds of meds and their hearts exploded."  Testosterone med too expensive. Adamantly declines injections. Refers to meds with expletive,loudly.  History Edwin Taylor has a past medical history of Hypertension; Hyperlipidemia; Gout; Obesity; Hernia; and Tobacco use.   He has no past surgical history on file.   His family history includes Arthritis in his mother; Birth defects in his sister; Cancer (age of onset: 52) in his father; Dementia in his maternal grandmother; Diabetes in his maternal grandmother and mother; Heart disease in his maternal grandfather and paternal grandmother.He reports that he has been smoking Cigarettes.  He has been smoking about 2.00 packs per day. He does not have any smokeless tobacco history on file. He reports that he drinks about 4.8 oz of alcohol per week. He reports that he does not use illicit drugs.  Current Outpatient Prescriptions on File Prior to Visit  Medication Sig  Dispense Refill  . allopurinol (ZYLOPRIM) 300 MG tablet Take 1 tablet (300 mg total) by mouth daily. 30 tablet 5  . amLODipine (NORVASC) 5 MG tablet Take 1 tablet (5 mg total) by mouth daily. 30 tablet 5  . atorvastatin (LIPITOR) 40 MG tablet Take 1 tablet (40 mg total) by mouth daily. 90 tablet 1  . lisinopril (PRINIVIL,ZESTRIL) 10 MG tablet Take 1 tablet (10 mg total) by mouth daily. 30 tablet 5  . albuterol (PROVENTIL HFA;VENTOLIN HFA) 108 (90 BASE) MCG/ACT inhaler Inhale 2 puffs into the lungs every 6 (six) hours as needed for wheezing or shortness of breath. (Patient not taking: Reported on 03/01/2015) 1 Inhaler 0  . colchicine 0.6 MG tablet Take 1 tablet (0.6 mg total) by mouth daily. (Patient not taking: Reported on 03/01/2015) 10 tablet 2   No current facility-administered medications on file prior to visit.    ROS Review of Systems  Constitutional: Negative for fever, chills and diaphoresis.  HENT: Negative for congestion, rhinorrhea and sore throat.   Respiratory: Negative for cough, shortness of breath and wheezing.   Cardiovascular: Negative for chest pain.  Gastrointestinal: Negative for nausea, vomiting, abdominal pain, diarrhea, constipation and abdominal distention.  Genitourinary: Negative for dysuria and frequency.  Musculoskeletal: Negative for joint swelling and arthralgias.  Skin: Negative for rash.  Neurological: Negative for headaches.    Objective:  BP 139/74 mmHg  Pulse 58  Temp(Src) 97 F (36.1 C) (Oral)  Ht _0  (1.702 m)  Wt 224 lb (101.606 kg)  BMI 35.08 kg/m2  BP Readings from Last 3 Encounters:  03/01/15 139/74  12/13/14 116/75  08/30/14 140/75    Wt Readings from  Last 3 Encounters:  03/01/15 224 lb (101.606 kg)  12/13/14 222 lb (100.699 kg)  08/30/14 222 lb (100.699 kg)     Physical Exam  Constitutional: He is oriented to person, place, and time. He appears well-developed and well-nourished. No distress.  MOderately obese.  Pack of  [place] in shirt pocket  HENT:  Head: Normocephalic and atraumatic.  Right Ear: External ear normal.  Left Ear: External ear normal.  Nose: Nose normal.  Mouth/Throat: Oropharynx is clear and moist.  Eyes: Conjunctivae and EOM are normal. Pupils are equal, round, and reactive to light.  Neck: Normal range of motion. Neck supple. No thyromegaly present.  Cardiovascular: Normal rate, regular rhythm and normal heart sounds.   No murmur heard. Pulmonary/Chest: Effort normal and breath sounds normal. No respiratory distress. He has no wheezes. He has no rales.  Abdominal: Soft. Bowel sounds are normal. He exhibits no distension. There is no tenderness.  Musculoskeletal: Normal range of motion.  Lymphadenopathy:    He has no cervical adenopathy.  Neurological: He is alert and oriented to person, place, and time. He has normal reflexes.  Skin: Skin is warm and dry.  Psychiatric: He has a normal mood and affect. His behavior is normal. Judgment and thought content normal.    No results found for: HGBA1C  Lab Results  Component Value Date   WBC 10.9* 03/13/2013   HGB 16.1 03/13/2013   HCT 47.9 03/13/2013   GLUCOSE 120* 08/30/2014   CHOL 139 08/30/2014   TRIG 203* 08/30/2014   HDL 37* 08/30/2014   LDLCALC 61 08/30/2014   ALT 11 08/30/2014   AST 16 08/30/2014   NA 140 08/30/2014   K 5.2 08/30/2014   CL 100 08/30/2014   CREATININE 1.24 08/30/2014   BUN 12 08/30/2014   CO2 25 08/30/2014   TSH 4.160 08/30/2014      Assessment & Plan:   Edwin Taylor was seen today for hyperlipidemia, hypertension, gout and medication refill.  Diagnoses and all orders for this visit:  Essential hypertension Orders: -     CMP14+EGFR  Hyperlipemia Orders: -     CMP14+EGFR -     Lipid panel  Testosterone deficiency  Idiopathic gout, unspecified chronicity, unspecified site Orders: -     Uric acid  Noncompliance with therapeutic plan  I have discontinued Edwin Taylor's azithromycin. I am also  having him maintain his allopurinol, amLODipine, atorvastatin, colchicine, lisinopril, and albuterol.  No orders of the defined types were placed in this encounter.     Follow-up: Return in about 6 months (around 09/01/2015) for hypertension, cholesterol.  Claretta Fraise, M.D.

## 2015-03-01 ENCOUNTER — Encounter: Payer: Self-pay | Admitting: Family Medicine

## 2015-03-01 ENCOUNTER — Encounter (INDEPENDENT_AMBULATORY_CARE_PROVIDER_SITE_OTHER): Payer: Self-pay

## 2015-03-01 ENCOUNTER — Ambulatory Visit (INDEPENDENT_AMBULATORY_CARE_PROVIDER_SITE_OTHER): Payer: No Typology Code available for payment source | Admitting: Family Medicine

## 2015-03-01 VITALS — BP 139/74 | HR 58 | Temp 97.0°F | Ht 67.0 in | Wt 224.0 lb

## 2015-03-01 DIAGNOSIS — Z9111 Patient's noncompliance with dietary regimen: Secondary | ICD-10-CM | POA: Diagnosis not present

## 2015-03-01 DIAGNOSIS — M1000 Idiopathic gout, unspecified site: Secondary | ICD-10-CM | POA: Diagnosis not present

## 2015-03-01 DIAGNOSIS — E785 Hyperlipidemia, unspecified: Secondary | ICD-10-CM | POA: Diagnosis not present

## 2015-03-01 DIAGNOSIS — E291 Testicular hypofunction: Secondary | ICD-10-CM

## 2015-03-01 DIAGNOSIS — I10 Essential (primary) hypertension: Secondary | ICD-10-CM

## 2015-03-01 DIAGNOSIS — Z91199 Patient's noncompliance with other medical treatment and regimen due to unspecified reason: Secondary | ICD-10-CM

## 2015-03-01 DIAGNOSIS — E349 Endocrine disorder, unspecified: Secondary | ICD-10-CM

## 2015-03-02 LAB — CMP14+EGFR
A/G RATIO: 1.8 (ref 1.1–2.5)
ALBUMIN: 4.2 g/dL (ref 3.5–5.5)
ALK PHOS: 85 IU/L (ref 39–117)
ALT: 13 IU/L (ref 0–44)
AST: 14 IU/L (ref 0–40)
BUN / CREAT RATIO: 7 — AB (ref 9–20)
BUN: 9 mg/dL (ref 6–24)
Bilirubin Total: 0.4 mg/dL (ref 0.0–1.2)
CO2: 24 mmol/L (ref 18–29)
CREATININE: 1.24 mg/dL (ref 0.76–1.27)
Calcium: 9.1 mg/dL (ref 8.7–10.2)
Chloride: 99 mmol/L (ref 97–108)
GFR calc Af Amer: 75 mL/min/{1.73_m2} (ref >59)
GFR, EST NON AFRICAN AMERICAN: 65 mL/min/{1.73_m2} (ref >59)
GLOBULIN, TOTAL: 2.3 g/dL (ref 1.5–4.5)
Glucose: 123 mg/dL — ABNORMAL HIGH (ref 65–99)
Potassium: 4.8 mmol/L (ref 3.5–5.2)
SODIUM: 140 mmol/L (ref 134–144)
Total Protein: 6.5 g/dL (ref 6.0–8.5)

## 2015-03-02 LAB — LIPID PANEL
CHOL/HDL RATIO: 4.3 ratio (ref 0.0–5.0)
CHOLESTEROL TOTAL: 150 mg/dL (ref 100–199)
HDL: 35 mg/dL — AB (ref >39)
LDL CALC: 65 mg/dL (ref 0–99)
Triglycerides: 251 mg/dL — ABNORMAL HIGH (ref 0–149)
VLDL Cholesterol Cal: 50 mg/dL — ABNORMAL HIGH (ref 5–40)

## 2015-03-02 LAB — URIC ACID: Uric Acid: 7.8 mg/dL (ref 3.7–8.6)

## 2015-03-04 ENCOUNTER — Telehealth: Payer: Self-pay | Admitting: *Deleted

## 2015-03-04 NOTE — Telephone Encounter (Signed)
-----   Message from Mechele Claude, MD sent at 03/04/2015  2:01 PM EDT ----- Windy Fast,    Your lab result is normal.The glucose is borderline and the triglycerides (fats) in your blood are high. Focus on a healthy low fat, low carb diet. rECHECK 6 MOS. Mechele Claude, M.D.

## 2015-03-04 NOTE — Telephone Encounter (Signed)
Patient aware.

## 2015-03-20 ENCOUNTER — Encounter: Payer: Self-pay | Admitting: *Deleted

## 2015-05-01 ENCOUNTER — Telehealth: Payer: Self-pay | Admitting: Family Medicine

## 2015-05-01 ENCOUNTER — Encounter: Payer: Self-pay | Admitting: Physician Assistant

## 2015-05-01 ENCOUNTER — Ambulatory Visit (INDEPENDENT_AMBULATORY_CARE_PROVIDER_SITE_OTHER): Payer: No Typology Code available for payment source | Admitting: Physician Assistant

## 2015-05-01 ENCOUNTER — Ambulatory Visit (INDEPENDENT_AMBULATORY_CARE_PROVIDER_SITE_OTHER): Payer: No Typology Code available for payment source

## 2015-05-01 VITALS — BP 118/69 | HR 77 | Temp 97.9°F | Ht 67.0 in | Wt 222.0 lb

## 2015-05-01 DIAGNOSIS — R05 Cough: Secondary | ICD-10-CM | POA: Diagnosis not present

## 2015-05-01 DIAGNOSIS — J189 Pneumonia, unspecified organism: Secondary | ICD-10-CM | POA: Diagnosis not present

## 2015-05-01 DIAGNOSIS — R059 Cough, unspecified: Secondary | ICD-10-CM

## 2015-05-01 MED ORDER — HYDROCODONE-HOMATROPINE 5-1.5 MG/5ML PO SYRP: 5.0000 mL | ORAL_SOLUTION | Freq: Three times a day (TID) | ORAL | Status: DC | PRN

## 2015-05-01 MED ORDER — LEVOFLOXACIN 500 MG PO TABS: 500.0000 mg | ORAL_TABLET | Freq: Every day | ORAL | Status: DC

## 2015-05-01 MED ORDER — PREDNISONE 10 MG (21) PO TBPK: ORAL_TABLET | ORAL | Status: DC

## 2015-05-01 NOTE — Patient Instructions (Signed)
Over the counter plain musinex as directed Use inhaler that you were given at last visit.

## 2015-05-01 NOTE — Progress Notes (Addendum)
   Subjective:    Patient ID: Edwin Taylor, male    DOB: [birth year], 54 y.o.   MRN: [id number]  HPI 54 y/o male presents with c/o cough and chest congestion x 1 day. He has tried robitussin Dm with no relief. Had Hycodan that was given to him a long time ago that provided some relief. He had pneumonia and bronchitis last year. Smoker - smokes 1-2 ppd.     Review of Systems  Constitutional: Positive for chills, diaphoresis and fatigue. Negative for fever.  HENT: Positive for congestion (chest ) and sore throat.   Respiratory: Positive for cough (productive ), chest tightness and wheezing.        Objective:   Physical Exam  Constitutional: He is oriented to person, place, and time. He appears well-developed and well-nourished.  Pulmonary/Chest: Effort normal. No respiratory distress. He has wheezes.  Neurological: He is alert and oriented to person, place, and time.          Assessment & Plan:  1. Cough  - DG Chest 2 View; Future - predniSONE (STERAPRED UNI-PAK 21 TAB) 10 MG (21) TBPK tablet; 6 pills PO on day 1, 5 on day 2, 4 on day 3, 3 on day 4, 2 on day 5, 1 on day 6  Dispense: 21 tablet; Refill: 0 - levofloxacin (LEVAQUIN) 500 MG tablet; Take 1 tablet (500 mg total) by mouth daily.  Dispense: 7 tablet; Refill: 0 - HYDROcodone-homatropine (HYCODAN) 5-1.5 MG/5ML syrup; Take 5 mLs by mouth every 8 (eight) hours as needed for cough.  Dispense: 120 mL; Refill: 0  2. CAP (community acquired pneumonia)  - predniSONE (STERAPRED UNI-PAK 21 TAB) 10 MG (21) TBPK tablet; 6 pills PO on day 1, 5 on day 2, 4 on day 3, 3 on day 4, 2 on day 5, 1 on day 6  Dispense: 21 tablet; Refill: 0 - levofloxacin (LEVAQUIN) 500 MG tablet; Take 1 tablet (500 mg total) by mouth daily.  Dispense: 7 tablet; Refill: 0 - HYDROcodone-homatropine (HYCODAN) 5-1.5 MG/5ML syrup; Take 5 mLs by mouth every 8 (eight) hours as needed for cough.  Dispense: 120 mL; Refill: 0  OTC plain musinex Force Fluids Use  inhaler that you have at home Rest  No smoking    Continue all meds Labs pending Health Maintenance reviewed Diet and exercise encouraged RTO 2 weeks   Tiffany A. Chauncey Reading PA-C

## 2015-05-01 NOTE — Telephone Encounter (Signed)
Appointment given for 2:10 with Tiffany.

## 2015-05-08 ENCOUNTER — Telehealth: Payer: Self-pay | Admitting: Family Medicine

## 2015-05-08 NOTE — Telephone Encounter (Signed)
Pt still has the cough really bad, shortness of breath when laying down and running nose. Still has one does of abx, prednisone & still has come cough med too.

## 2015-05-08 NOTE — Telephone Encounter (Signed)
Patient was treated aggressively with Levaquin and prednisone and had a chest x-ray. Because he is not feeling better he should come back in and be seen by a provider rather than just calling something else in. He should finish the Levaquin but he still needs to come in to be seen again because of not feeling better

## 2015-05-08 NOTE — Telephone Encounter (Signed)
Appt made for tomorrow afternoon

## 2015-05-09 ENCOUNTER — Ambulatory Visit (INDEPENDENT_AMBULATORY_CARE_PROVIDER_SITE_OTHER): Payer: No Typology Code available for payment source | Admitting: Family Medicine

## 2015-05-09 ENCOUNTER — Encounter: Payer: Self-pay | Admitting: Family Medicine

## 2015-05-09 VITALS — BP 138/81 | HR 60 | Temp 97.6°F | Ht 67.0 in | Wt 218.0 lb

## 2015-05-09 DIAGNOSIS — J441 Chronic obstructive pulmonary disease with (acute) exacerbation: Secondary | ICD-10-CM

## 2015-05-09 MED ORDER — FLUTICASONE FUROATE-VILANTEROL 100-25 MCG/INH IN AEPB: 1.0000 | INHALATION_SPRAY | Freq: Every day | RESPIRATORY_TRACT | Status: DC

## 2015-05-09 MED ORDER — PREDNISONE 20 MG PO TABS: ORAL_TABLET | ORAL | Status: DC

## 2015-05-09 NOTE — Progress Notes (Signed)
BP 138/81 mmHg  Pulse 60  Temp(Src) 97.6 F (36.4 C) (Oral)  Ht '5\' 7"'  (1.702 m)  Wt 218 lb (98.884 kg)  BMI 34.14 kg/m2  SpO2 97%   Subjective:    Patient ID: Edwin Taylor, male    DOB: 1961-03-21, 54 y.o.   MRN: [id number]  HPI: Edwin Taylor is a 54 y.o. male presenting on 05/09/2015 for Followup cough, chest and nasal congestion and Shortness of Breath   HPI Cough and difficulty breathing Patient has a 2 week history of coughing and shortness of breath and wheezing. He does admit to smoking 2 packs per day since he was 14. He had pneumonia last year and was diagnosed with pneumonia a week ago. He took his Levaquin and today is the last day and also took his oral steroids taper of which yesterday was the last day. He improved some with those but he still having a lot of tightness with breathing and a lot of coughing all laying flat and difficulty breathing. He also admits to wheezing and continued smoking. He denies fevers or chills.  Relevant past medical, surgical, family and social history reviewed and updated as indicated. Interim medical history since our last visit reviewed. Allergies and medications reviewed and updated.  Review of Systems  Constitutional: Negative for fever and chills.  HENT: Positive for congestion, postnasal drip, rhinorrhea, sinus pressure and sore throat. Negative for ear discharge, ear pain, sneezing and voice change.   Eyes: Negative for pain, discharge, redness and visual disturbance.  Respiratory: Positive for cough, chest tightness, shortness of breath and wheezing.   Cardiovascular: Negative for chest pain, palpitations and leg swelling.  Gastrointestinal: Negative for abdominal pain, diarrhea and constipation.  Genitourinary: Negative for difficulty urinating.  Musculoskeletal: Negative for back pain and gait problem.  Skin: Negative for rash.  Neurological: Negative for syncope, light-headedness and headaches.  All other systems  reviewed and are negative.   Per HPI unless specifically indicated above     Medication List       This list is accurate as of: 05/09/15  3:58 PM.  Always use your most recent med list.               allopurinol 300 MG tablet  Commonly known as:  ZYLOPRIM  Take 1 tablet (300 mg total) by mouth daily.     amLODipine 5 MG tablet  Commonly known as:  NORVASC  Take 1 tablet (5 mg total) by mouth daily.     atorvastatin 40 MG tablet  Commonly known as:  LIPITOR  Take 1 tablet (40 mg total) by mouth daily.     colchicine 0.6 MG tablet  Take 1 tablet (0.6 mg total) by mouth daily.     Fluticasone Furoate-Vilanterol 100-25 MCG/INH Aepb  Inhale 1 puff into the lungs daily.     HYDROcodone-homatropine 5-1.5 MG/5ML syrup  Commonly known as:  HYCODAN  Take 5 mLs by mouth every 8 (eight) hours as needed for cough.     levofloxacin 500 MG tablet  Commonly known as:  LEVAQUIN  Take 1 tablet (500 mg total) by mouth daily.     lisinopril 10 MG tablet  Commonly known as:  PRINIVIL,ZESTRIL  Take 1 tablet (10 mg total) by mouth daily.     predniSONE 20 MG tablet  Commonly known as:  DELTASONE  Take 3 tabs daily for 1 week, then 2 tabs daily for week 2, then 1 tab daily for week 3.  Objective:    BP 138/81 mmHg  Pulse 60  Temp(Src) 97.6 F (36.4 C) (Oral)  Ht '5\' 7"'  (1.702 m)  Wt 218 lb (98.884 kg)  BMI 34.14 kg/m2  SpO2 97%  Wt Readings from Last 3 Encounters:  05/09/15 218 lb (98.884 kg)  05/01/15 222 lb (100.699 kg)  03/01/15 224 lb (101.606 kg)    Physical Exam  Constitutional: He is oriented to person, place, and time. He appears well-developed and well-nourished. No distress.  HENT:  Right Ear: Tympanic membrane, external ear and ear canal normal.  Left Ear: Tympanic membrane, external ear and ear canal normal.  Nose: Mucosal edema and rhinorrhea present. No sinus tenderness. No epistaxis. Right sinus exhibits maxillary sinus tenderness. Right sinus  exhibits no frontal sinus tenderness. Left sinus exhibits maxillary sinus tenderness. Left sinus exhibits no frontal sinus tenderness.  Mouth/Throat: Uvula is midline and mucous membranes are normal. Posterior oropharyngeal edema and posterior oropharyngeal erythema present. No oropharyngeal exudate or tonsillar abscesses.  Eyes: Conjunctivae and EOM are normal. Right eye exhibits no discharge. No scleral icterus.  Cardiovascular: Normal rate, regular rhythm, normal heart sounds and intact distal pulses.   No murmur heard. Pulmonary/Chest: Effort normal. No respiratory distress. He has wheezes. He has no rales. He exhibits no tenderness.  Abdominal: He exhibits no distension.  Musculoskeletal: Normal range of motion. He exhibits no edema.  Neurological: He is alert and oriented to person, place, and time. Coordination normal.  Skin: Skin is warm and dry. No rash noted. He is not diaphoretic.  Psychiatric: He has a normal mood and affect. His behavior is normal.  Vitals reviewed.   Results for orders placed or performed in visit on 03/01/15  CMP14+EGFR  Result Value Ref Range   Glucose 123 (H) 65 - 99 mg/dL   BUN 9 6 - 24 mg/dL   Creatinine, Ser 1.24 0.76 - 1.27 mg/dL   GFR calc non Af Amer 65 >59 mL/min/1.73   GFR calc Af Amer 75 >59 mL/min/1.73   BUN/Creatinine Ratio 7 (L) 9 - 20   Sodium 140 134 - 144 mmol/L   Potassium 4.8 3.5 - 5.2 mmol/L   Chloride 99 97 - 108 mmol/L   CO2 24 18 - 29 mmol/L   Calcium 9.1 8.7 - 10.2 mg/dL   Total Protein 6.5 6.0 - 8.5 g/dL   Albumin 4.2 3.5 - 5.5 g/dL   Globulin, Total 2.3 1.5 - 4.5 g/dL   Albumin/Globulin Ratio 1.8 1.1 - 2.5   Bilirubin Total 0.4 0.0 - 1.2 mg/dL   Alkaline Phosphatase 85 39 - 117 IU/L   AST 14 0 - 40 IU/L   ALT 13 0 - 44 IU/L  Lipid panel  Result Value Ref Range   Cholesterol, Total 150 100 - 199 mg/dL   Triglycerides 251 (H) 0 - 149 mg/dL   HDL 35 (L) >39 mg/dL   VLDL Cholesterol Cal 50 (H) 5 - 40 mg/dL   LDL  Calculated 65 0 - 99 mg/dL   Chol/HDL Ratio 4.3 0.0 - 5.0 ratio units  Uric acid  Result Value Ref Range   Uric Acid 7.8 3.7 - 8.6 mg/dL      Assessment & Plan:   Problem List Items Addressed This Visit    None    Visit Diagnoses    COPD exacerbation ([place])    -  Primary    She has not been diagnosed with COPD but with smoking history and recurrent pneumonia and wheezing. We will  treat like COPD    Relevant Medications    predniSONE (DELTASONE) 20 MG tablet    Fluticasone Furoate-Vilanterol 100-25 MCG/INH AEPB         Follow up plan: Return in about 2 weeks (around 05/23/2015), or if symptoms worsen or fail to improve, for copd f/u.  Caryl Pina, MD [facility] 05/09/2015, 3:58 PM

## 2015-05-21 ENCOUNTER — Encounter: Payer: Self-pay | Admitting: Family Medicine

## 2015-05-21 ENCOUNTER — Ambulatory Visit (INDEPENDENT_AMBULATORY_CARE_PROVIDER_SITE_OTHER): Payer: No Typology Code available for payment source | Admitting: Family Medicine

## 2015-05-21 VITALS — BP 125/69 | HR 78 | Temp 97.1°F | Ht 67.0 in | Wt 216.8 lb

## 2015-05-21 DIAGNOSIS — I10 Essential (primary) hypertension: Secondary | ICD-10-CM

## 2015-05-21 DIAGNOSIS — J449 Chronic obstructive pulmonary disease, unspecified: Secondary | ICD-10-CM

## 2015-05-21 DIAGNOSIS — F17200 Nicotine dependence, unspecified, uncomplicated: Secondary | ICD-10-CM | POA: Diagnosis not present

## 2015-05-21 DIAGNOSIS — J441 Chronic obstructive pulmonary disease with (acute) exacerbation: Secondary | ICD-10-CM

## 2015-05-21 MED ORDER — ALBUTEROL SULFATE HFA 108 (90 BASE) MCG/ACT IN AERS: 2.0000 | INHALATION_SPRAY | RESPIRATORY_TRACT | Status: DC | PRN

## 2015-05-21 MED ORDER — BETAMETHASONE SOD PHOS & ACET 6 (3-3) MG/ML IJ SUSP: 6.0000 mg | Freq: Once | INTRAMUSCULAR | Status: DC

## 2015-05-21 NOTE — Progress Notes (Signed)
Subjective:  Patient ID: Edwin Taylor, male    DOB: June 16, 1961  Age: 54 y.o. MRN: [id number]  CC: Bronchitis   HPI Edwin Taylor presents for continued cough and DOE. Still taking prednisone, at 1 a day on taper. Finished his levaquin. Cough comes after he feels drainage. DCed cough med. Mucinex DM works just fine. Pt. States that smoking is not related to his bronchitis and he does not want to quit. Did not try the Fluticasone/vilanterol inhaler because he heard it would make bacteria grow in his mouth that might make his teeth even worse. Overall minimal improvement. Breathing unchanged or a bit worse since Recent OV.CXR reviewed from 9/28 showed no active disease officially.  History Edwin Taylor has a past medical history of Hypertension; Hyperlipidemia; Gout; Obesity; Hernia; and Tobacco use.   He has no past surgical history on file.   His family history includes Arthritis in his mother; Birth defects in his sister; Cancer (age of onset: 6060) in his father; Dementia in his maternal grandmother; Diabetes in his maternal grandmother and mother; Heart disease in his maternal grandfather and paternal grandmother.He reports that he has been smoking Cigarettes.  He has been smoking about 2.00 packs per day. He does not have any smokeless tobacco history on file. He reports that he drinks about 4.8 oz of alcohol per week. He reports that he does not use illicit drugs.  Outpatient Prescriptions Prior to Visit  Medication Sig Dispense Refill  . allopurinol (ZYLOPRIM) 300 MG tablet Take 1 tablet (300 mg total) by mouth daily. 30 tablet 5  . amLODipine (NORVASC) 5 MG tablet Take 1 tablet (5 mg total) by mouth daily. 30 tablet 5  . atorvastatin (LIPITOR) 40 MG tablet Take 1 tablet (40 mg total) by mouth daily. 90 tablet 1  . colchicine 0.6 MG tablet Take 1 tablet (0.6 mg total) by mouth daily. 10 tablet 2  . Fluticasone Furoate-Vilanterol 100-25 MCG/INH AEPB Inhale 1 puff into the lungs daily. 1  each 11  . HYDROcodone-homatropine (HYCODAN) 5-1.5 MG/5ML syrup Take 5 mLs by mouth every 8 (eight) hours as needed for cough. 120 mL 0  . lisinopril (PRINIVIL,ZESTRIL) 10 MG tablet Take 1 tablet (10 mg total) by mouth daily. 30 tablet 5  . predniSONE (DELTASONE) 20 MG tablet Take 3 tabs daily for 1 week, then 2 tabs daily for week 2, then 1 tab daily for week 3. 42 tablet 0  . levofloxacin (LEVAQUIN) 500 MG tablet Take 1 tablet (500 mg total) by mouth daily. (Patient not taking: Reported on 05/21/2015) 7 tablet 0   No facility-administered medications prior to visit.    ROS Review of Systems  Constitutional: Negative for fever, chills, activity change and appetite change.  HENT: Positive for congestion, postnasal drip and rhinorrhea. Negative for ear discharge, ear pain, hearing loss, nosebleeds, sinus pressure, sneezing and trouble swallowing.   Respiratory: Positive for cough and shortness of breath (with mild to moderate exertion.). Negative for chest tightness.   Cardiovascular: Negative for chest pain and palpitations.  Skin: Negative for rash.    Objective:  BP 125/69 mmHg  Pulse 78  Temp(Src) 97.1 F (36.2 C) (Oral)  Ht 5\' 7"  (1.702 m)  Wt 216 lb 12.8 oz (98.34 kg)  BMI 33.95 kg/m2  BP Readings from Last 3 Encounters:  05/21/15 125/69  05/09/15 138/81  05/01/15 118/69    Wt Readings from Last 3 Encounters:  05/21/15 216 lb 12.8 oz (98.34 kg)  05/09/15 218 lb (98.884  kg)  05/01/15 222 lb (100.699 kg)     Physical Exam  Constitutional: He is oriented to person, place, and time. He appears well-developed and well-nourished.  HENT:  Head: Normocephalic and atraumatic.  Right Ear: Tympanic membrane and external ear normal. No decreased hearing is noted.  Left Ear: Tympanic membrane and external ear normal. No decreased hearing is noted.  Nose: Mucosal edema present. Right sinus exhibits no frontal sinus tenderness. Left sinus exhibits no frontal sinus tenderness.    Mouth/Throat: No oropharyngeal exudate or posterior oropharyngeal erythema.  Eyes: EOM are normal. Pupils are equal, round, and reactive to light. No scleral icterus.  Neck: Normal range of motion. Neck supple. No tracheal deviation present. No Brudzinski's sign noted. No thyromegaly present.  Cardiovascular: Normal rate, regular rhythm and normal heart sounds.   Pulmonary/Chest: No stridor. No respiratory distress. He has wheezes. He has no rales. He exhibits no tenderness.  Lymphadenopathy:       Head (right side): No preauricular adenopathy present.       Head (left side): No preauricular adenopathy present.    He has no cervical adenopathy.       Right cervical: No superficial cervical adenopathy present.      Left cervical: No superficial cervical adenopathy present.  Neurological: He is alert and oriented to person, place, and time. Coordination normal.  Psychiatric:  Unusual affect, loud. 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 Planned MRI. ORBITS FOR FOREIGN BODY - 2 VIEW Comparison: None. Findings: There is no evidence of metallic foreign body within the orbits.  No significant bone abnormality identified. . IMPRESSION: No evidence of metallic foreign body within the orbits. Original Report Authenticated By: Harley Hallmark, M.D.  Edwin Taylor  01/14/2011  *RADIOLOGY REPORT* Clinical Data: Bilateral arm  numbness. MRI CERVICAL SPINE WITHOUT Taylor Technique:  Multiplanar and multiecho pulse sequences of the cervical spine, to include the craniocervical junction and cervicothoracic junction, were obtained according to standard protocol without intravenous Taylor. Comparison: None Findings: The sagittal Edwin images demonstrate normal alignment of the cervical vertebral bodies.  They demonstrate normal marrow signal.  The cervical spinal cord demonstrates normal signal intensity.  No Chiari malformation. C2-3:  No significant findings. C3-4:  Very small central disc protrusion with minimal impression on the ventral thecal sac.  Mild uncinate spurring changes bilaterally with mild foraminal encroachment. C4-5:  No significant findings. C5-6:  Small central disc protrusion with minimal impression on the ventral thecal sac.  Mild left foraminal encroachment due to shallow disc osteophyte complex. C6-7:  Bilateral foraminal disc protrusions and uncinate spurring changes contributing to stigmata and bilateral foraminal stenosis likely involving both C7 nerve roots.  No significant spinal stenosis. C7-T1:  No significant findings. IMPRESSION: 1.  Small central disc protrusions at C3-4 and C5-6  without significant neural compression. 2.  Significant bilateral foraminal stenosis at C6-C7 due to disc/osteophyte complexes. 3.  Mild foraminal encroachment bilaterally at C3-4 and on the left at C5-6. Original Report Authenticated By: P. Loralie Champagne, M.D.   Assessment & Plan:   Antwone was seen today for bronchitis.  Diagnoses and all orders for this visit:  COPD exacerbation (HCC) -     PR EVAL OF BRONCHOSPASM -     betamethasone acetate-betamethasone sodium phosphate (CELESTONE) injection 6 mg; Inject 1 mL (6 mg total) into the muscle once.  COPD mixed type (HCC) -     PR EVAL OF BRONCHOSPASM -     betamethasone acetate-betamethasone sodium phosphate (CELESTONE) injection 6 mg; Inject 1 mL (6 mg total) into the  muscle once.  Tobacco use disorder -     PR EVAL OF BRONCHOSPASM -     betamethasone acetate-betamethasone sodium phosphate (CELESTONE) injection 6 mg; Inject 1 mL (6 mg total) into the muscle once.  Essential hypertension -     PR EVAL OF BRONCHOSPASM -     betamethasone acetate-betamethasone sodium phosphate (CELESTONE) injection 6 mg; Inject 1 mL (6 mg total) into the muscle once.  Other orders -     albuterol (PROAIR HFA) 108 (90 BASE) MCG/ACT inhaler; Inhale 2 puffs into the lungs every 4 (four) hours as needed for wheezing or shortness of breath.   I have discontinued Edwin. [place]'s levofloxacin. I am also having him start on albuterol. Additionally, I am having him maintain his allopurinol, amLODipine, atorvastatin, colchicine, lisinopril, HYDROcodone-homatropine, predniSONE, and Fluticasone Furoate-Vilanterol. We will continue to administer betamethasone acetate-betamethasone sodium phosphate.  Meds ordered this encounter  Medications  . betamethasone acetate-betamethasone sodium phosphate (CELESTONE) injection 6 mg    Sig:   . albuterol (PROAIR HFA) 108 (90 BASE) MCG/ACT inhaler    Sig: Inhale 2 puffs into the lungs every 4 (four) hours as needed for wheezing or shortness of breath.    Dispense:  1 Inhaler    Refill:  11   Pt. Declined CXR f/u today. PFT shows severe obstruction. I encouraged regular use of LABA/ICS   over 40 minutes was spent with this patient more than half of which was in discussion with this patient about his diagnosis of COPD, its long-term applications and the effect of smoking on it..   Follow-up: Return in about 6 weeks (around 07/02/2015), or if symptoms worsen or fail to improve, for COPD.  Mechele Claude, M.D.

## 2015-09-02 ENCOUNTER — Ambulatory Visit: Payer: No Typology Code available for payment source | Admitting: Family Medicine

## 2015-09-03 ENCOUNTER — Encounter: Payer: Self-pay | Admitting: Family Medicine

## 2015-09-03 ENCOUNTER — Ambulatory Visit (INDEPENDENT_AMBULATORY_CARE_PROVIDER_SITE_OTHER): Payer: No Typology Code available for payment source | Admitting: Family Medicine

## 2015-09-03 VITALS — BP 140/75 | HR 82 | Temp 97.3°F | Ht 67.0 in | Wt 225.4 lb

## 2015-09-03 DIAGNOSIS — F17200 Nicotine dependence, unspecified, uncomplicated: Secondary | ICD-10-CM | POA: Diagnosis not present

## 2015-09-03 DIAGNOSIS — J40 Bronchitis, not specified as acute or chronic: Secondary | ICD-10-CM | POA: Diagnosis not present

## 2015-09-03 DIAGNOSIS — E785 Hyperlipidemia, unspecified: Secondary | ICD-10-CM

## 2015-09-03 DIAGNOSIS — E669 Obesity, unspecified: Secondary | ICD-10-CM | POA: Diagnosis not present

## 2015-09-03 DIAGNOSIS — J449 Chronic obstructive pulmonary disease, unspecified: Secondary | ICD-10-CM | POA: Diagnosis not present

## 2015-09-03 DIAGNOSIS — M109 Gout, unspecified: Secondary | ICD-10-CM | POA: Diagnosis not present

## 2015-09-03 DIAGNOSIS — I10 Essential (primary) hypertension: Secondary | ICD-10-CM

## 2015-09-03 DIAGNOSIS — J329 Chronic sinusitis, unspecified: Secondary | ICD-10-CM

## 2015-09-03 MED ORDER — ATORVASTATIN CALCIUM 40 MG PO TABS: 40.0000 mg | ORAL_TABLET | Freq: Every day | ORAL | Status: DC

## 2015-09-03 MED ORDER — ALBUTEROL SULFATE HFA 108 (90 BASE) MCG/ACT IN AERS: 2.0000 | INHALATION_SPRAY | RESPIRATORY_TRACT | Status: DC | PRN

## 2015-09-03 MED ORDER — AMOXICILLIN-POT CLAVULANATE 875-125 MG PO TABS: 1.0000 | ORAL_TABLET | Freq: Two times a day (BID) | ORAL | Status: DC

## 2015-09-03 MED ORDER — ALLOPURINOL 300 MG PO TABS: 300.0000 mg | ORAL_TABLET | Freq: Every day | ORAL | Status: DC

## 2015-09-03 MED ORDER — AMLODIPINE BESYLATE 5 MG PO TABS: 5.0000 mg | ORAL_TABLET | Freq: Every day | ORAL | Status: DC

## 2015-09-03 MED ORDER — COLCHICINE 0.6 MG PO TABS: 0.6000 mg | ORAL_TABLET | Freq: Every day | ORAL | Status: DC

## 2015-09-03 NOTE — Progress Notes (Signed)
Subjective:  Patient ID: Edwin Taylor, male    DOB: 09/11/1960  Age: 55 y.o. MRN: [id number]  CC: Hypertension and Hyperlipidemia   HPI Edwin Taylor presents for  follow-up of hypertension. Patient has no history of headache chest pain or shortness of breath or recent cough. Patient also denies symptoms of TIA such as numbness weakness lateralizing. Patient checks  blood pressure at home and has not had any elevated readings recently. Patient denies side effects from his medication. states he just takes it when he thinks about it. That could be anywhere from every second or third day.   Patient also  in for follow-up of elevated cholesterol. Doing well without complaints on current medication. Denies side effects of statin including myalgia and arthralgia and nausea. Also in today for liver function testing. Currently no chest pain, shortness of breath or other cardiovascular related symptoms noted. Patient denies any recent gout attacks he does continue to take the allopurinol and colchicine. With regard to breathing he does use his inhaler a couple of times a day just when he gets really dizzy and feel short of breath. Additionally he's noted over the last couple weeks he's having an increase in cough and purulent sputum. Some increased shortness of breath and increased use of inhaler.  Patient states he has absolutely no desire to quit smoking or lose weight. He says he wants to just live and let live. If he is going to die from a heart attack he is going to he realizes that's just the way of life and does not think that medication will help. It is unclear why he continues to take the medicine but he is willing to at least try to do that although he does not promise to improve his irregular habit.  History Edwin Taylor has a past medical history of Hypertension; Hyperlipidemia; Gout; Obesity; Hernia; and Tobacco use.   He has no past surgical history on file.   His family history includes  Arthritis in his mother; Birth defects in his sister; Cancer (age of onset: 41) in his father; Dementia in his maternal grandmother; Diabetes in his maternal grandmother and mother; Heart disease in his maternal grandfather and paternal grandmother.He reports that he has been smoking Cigarettes.  He has been smoking about 2.00 packs per day. He does not have any smokeless tobacco history on file. He reports that he drinks about 4.8 oz of alcohol per week. He reports that he does not use illicit drugs.  Current Outpatient Prescriptions on File Prior to Visit  Medication Sig Dispense Refill  . albuterol (PROAIR HFA) 108 (90 BASE) MCG/ACT inhaler Inhale 2 puffs into the lungs every 4 (four) hours as needed for wheezing or shortness of breath. 1 Inhaler 11  . allopurinol (ZYLOPRIM) 300 MG tablet Take 1 tablet (300 mg total) by mouth daily. 30 tablet 5  . amLODipine (NORVASC) 5 MG tablet Take 1 tablet (5 mg total) by mouth daily. 30 tablet 5  . atorvastatin (LIPITOR) 40 MG tablet Take 1 tablet (40 mg total) by mouth daily. 90 tablet 1  . colchicine 0.6 MG tablet Take 1 tablet (0.6 mg total) by mouth daily. 10 tablet 2   Current Facility-Administered Medications on File Prior to Visit  Medication Dose Route Frequency Provider Last Rate Last Dose  . betamethasone acetate-betamethasone sodium phosphate (CELESTONE) injection 6 mg  6 mg Intramuscular Once Claretta Fraise, MD   6 mg at 05/21/15 0930    ROS Review of Systems  Constitutional: Negative for fever, chills, diaphoresis and unexpected weight change.  HENT: Negative for congestion, hearing loss, rhinorrhea and sore throat.   Eyes: Negative for visual disturbance.  Respiratory: Negative for cough and shortness of breath.   Cardiovascular: Negative for chest pain.  Gastrointestinal: Negative for abdominal pain, diarrhea and constipation.  Genitourinary: Negative for dysuria and flank pain.  Musculoskeletal: Negative for joint swelling and  arthralgias.  Skin: Negative for rash.  Neurological: Negative for dizziness and headaches.  Psychiatric/Behavioral: Negative for sleep disturbance and dysphoric mood.    Objective:  BP 140/75 mmHg  Pulse 82  Temp(Src) 97.3 F (36.3 C) (Oral)  Ht _0  (1.702 m)  Wt 225 lb 6.4 oz (102.241 kg)  BMI 35.29 kg/m2  SpO2 97%  BP Readings from Last 3 Encounters:  09/03/15 140/75  05/21/15 125/69  05/09/15 138/81    Wt Readings from Last 3 Encounters:  09/03/15 225 lb 6.4 oz (102.241 kg)  05/21/15 216 lb 12.8 oz (98.34 kg)  05/09/15 218 lb (98.884 kg)     Physical Exam  Constitutional: He is oriented to person, place, and time. He appears well-developed and well-nourished. No distress.  HENT:  Head: Normocephalic and atraumatic.  Right Ear: External ear normal.  Left Ear: External ear normal.  Nose: Nose normal.  Mouth/Throat: Oropharynx is clear and moist.  Eyes: Conjunctivae and EOM are normal. Pupils are equal, round, and reactive to light.  Neck: Normal range of motion. Neck supple. No thyromegaly present.  Cardiovascular: Normal rate, regular rhythm and normal heart sounds.   No murmur heard. Pulmonary/Chest: Effort normal and breath sounds normal. No respiratory distress. He has no wheezes. He has no rales.  Abdominal: Soft. Bowel sounds are normal. He exhibits no distension. There is no tenderness.  Lymphadenopathy:    He has no cervical adenopathy.  Neurological: He is alert and oriented to person, place, and time. He has normal reflexes.  Skin: Skin is warm and dry.  Psychiatric: He has a normal mood and affect. His behavior is normal. Judgment and thought content normal.    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 Planned MRI. ORBITS FOR FOREIGN BODY - 2 VIEW Comparison: None. Findings: There is no evidence of metallic foreign body within the orbits.  No significant bone abnormality identified. . IMPRESSION: No evidence of metallic foreign body within the orbits. Original Report Authenticated By: Randall An, M.D.  Edwin Taylor  01/14/2011  *RADIOLOGY REPORT* Clinical Data: Bilateral arm numbness. MRI CERVICAL SPINE WITHOUT Taylor Technique:  Multiplanar and multiecho pulse sequences of the cervical spine, to include the craniocervical junction and cervicothoracic junction, were obtained according to standard protocol without intravenous Taylor. Comparison: None Findings: The sagittal Edwin images demonstrate normal alignment of the cervical vertebral bodies.  They demonstrate normal marrow signal.  The cervical spinal cord  demonstrates normal signal intensity.  No Chiari malformation. C2-3:  No significant findings. C3-4:  Very small central disc protrusion with minimal impression on the ventral thecal sac.  Mild uncinate spurring changes bilaterally with mild foraminal encroachment. C4-5:  No significant findings. C5-6:  Small central disc protrusion with minimal impression on the ventral thecal sac.  Mild left foraminal encroachment due to shallow disc osteophyte complex. C6-7:  Bilateral foraminal disc protrusions and uncinate spurring changes contributing to stigmata and bilateral foraminal stenosis likely involving both C7 nerve roots.  No significant spinal stenosis. C7-T1:  No significant findings. IMPRESSION: 1.  Small central disc protrusions at C3-4 and C5-6 without significant neural compression. 2.   Significant bilateral foraminal stenosis at C6-C7 due to disc/osteophyte complexes. 3.  Mild foraminal encroachment bilaterally at C3-4 and on the left at C5-6. Original Report Authenticated By: P. Kalman Jewels, M.D.   Assessment & Plan:   Mohit was seen today for hypertension and hyperlipidemia.  Diagnoses and all orders for this visit:  COPD mixed type ([place]) -     CMP14+EGFR  Gout of multiple sites, unspecified cause, unspecified chronicity -     CMP14+EGFR -     Uric acid  Essential hypertension -     CMP14+EGFR  Hyperlipemia -     CMP14+EGFR -     Lipid panel  Obesity, unspecified -     CMP14+EGFR -     Lipid panel  Tobacco use disorder  Sinobronchitis -     CMP14+EGFR  Other orders -     amoxicillin-clavulanate (AUGMENTIN) 875-125 MG tablet; Take 1 tablet by mouth 2 (two) times daily. Take all of this medication   I have discontinued Edwin. Taylor's lisinopril, HYDROcodone-homatropine, predniSONE, and Fluticasone Furoate-Vilanterol. I am also having him start on amoxicillin-clavulanate. Additionally, I am having him maintain his allopurinol, amLODipine, atorvastatin, colchicine, and albuterol. We will continue to administer betamethasone acetate-betamethasone sodium phosphate.  Meds ordered this encounter  Medications  . amoxicillin-clavulanate (AUGMENTIN) 875-125 MG tablet    Sig: Take 1 tablet by mouth 2 (two) times daily. Take all of this medication    Dispense:  20 tablet    Refill:  0     Follow-up: Return in about 6 months (around 03/02/2016).  Claretta Fraise, M.D.

## 2015-09-03 NOTE — Addendum Note (Signed)
Addended by: Bearl Mulberry on: 09/03/2015 09:53 AM   Modules accepted: Orders

## 2015-09-04 ENCOUNTER — Other Ambulatory Visit: Payer: Self-pay | Admitting: *Deleted

## 2015-09-04 LAB — URIC ACID: Uric Acid: 7.4 mg/dL (ref 3.7–8.6)

## 2015-09-04 LAB — CMP14+EGFR
A/G RATIO: 1.7 (ref 1.1–2.5)
ALK PHOS: 80 IU/L (ref 39–117)
ALT: 11 IU/L (ref 0–44)
AST: 16 IU/L (ref 0–40)
Albumin: 4.3 g/dL (ref 3.5–5.5)
BUN/Creatinine Ratio: 8 — ABNORMAL LOW (ref 9–20)
BUN: 10 mg/dL (ref 6–24)
Bilirubin Total: 0.2 mg/dL (ref 0.0–1.2)
CHLORIDE: 99 mmol/L (ref 96–106)
CO2: 25 mmol/L (ref 18–29)
Calcium: 9.2 mg/dL (ref 8.7–10.2)
Creatinine, Ser: 1.31 mg/dL — ABNORMAL HIGH (ref 0.76–1.27)
GFR calc Af Amer: 70 mL/min/{1.73_m2} (ref >59)
GFR calc non Af Amer: 61 mL/min/{1.73_m2} (ref >59)
GLOBULIN, TOTAL: 2.5 g/dL (ref 1.5–4.5)
Glucose: 136 mg/dL — ABNORMAL HIGH (ref 65–99)
POTASSIUM: 4.6 mmol/L (ref 3.5–5.2)
SODIUM: 139 mmol/L (ref 134–144)
Total Protein: 6.8 g/dL (ref 6.0–8.5)

## 2015-09-04 LAB — LIPID PANEL
CHOLESTEROL TOTAL: 158 mg/dL (ref 100–199)
Chol/HDL Ratio: 4 ratio units (ref 0.0–5.0)
HDL: 40 mg/dL (ref >39)
LDL Calculated: 72 mg/dL (ref 0–99)
TRIGLYCERIDES: 229 mg/dL — AB (ref 0–149)
VLDL Cholesterol Cal: 46 mg/dL — ABNORMAL HIGH (ref 5–40)

## 2015-09-05 ENCOUNTER — Telehealth: Payer: Self-pay

## 2015-09-05 NOTE — Telephone Encounter (Signed)
x

## 2015-09-05 NOTE — Telephone Encounter (Signed)
Insurance will not prior authorize Colcrys without patient trying both generic coltrizine and mitigare

## 2015-09-06 MED ORDER — COLCHICINE-PROBENECID 0.5-500 MG PO TABS: 1.0000 | ORAL_TABLET | Freq: Two times a day (BID) | ORAL | Status: DC

## 2015-10-31 ENCOUNTER — Other Ambulatory Visit: Payer: Self-pay | Admitting: Family Medicine

## 2015-12-31 ENCOUNTER — Emergency Department (HOSPITAL_COMMUNITY)
Admission: EM | Admit: 2015-12-31 | Discharge: 2015-12-31 | Disposition: A | Discharge status: Home / Self Care | Payer: Worker's Compensation | Attending: Emergency Medicine | Admitting: Emergency Medicine

## 2015-12-31 ENCOUNTER — Encounter (HOSPITAL_COMMUNITY): Payer: Self-pay | Admitting: Emergency Medicine

## 2015-12-31 ENCOUNTER — Emergency Department (HOSPITAL_COMMUNITY): Payer: Worker's Compensation

## 2015-12-31 DIAGNOSIS — Z79899 Other long term (current) drug therapy: Secondary | ICD-10-CM | POA: Diagnosis not present

## 2015-12-31 DIAGNOSIS — F17210 Nicotine dependence, cigarettes, uncomplicated: Secondary | ICD-10-CM | POA: Diagnosis not present

## 2015-12-31 DIAGNOSIS — Y939 Activity, unspecified: Secondary | ICD-10-CM | POA: Diagnosis not present

## 2015-12-31 DIAGNOSIS — Z23 Encounter for immunization: Secondary | ICD-10-CM | POA: Diagnosis not present

## 2015-12-31 DIAGNOSIS — S61216A Laceration without foreign body of right little finger without damage to nail, initial encounter: Secondary | ICD-10-CM | POA: Diagnosis not present

## 2015-12-31 DIAGNOSIS — S6991XA Unspecified injury of right wrist, hand and finger(s), initial encounter: Secondary | ICD-10-CM | POA: Diagnosis present

## 2015-12-31 DIAGNOSIS — Z792 Long term (current) use of antibiotics: Secondary | ICD-10-CM | POA: Insufficient documentation

## 2015-12-31 DIAGNOSIS — I10 Essential (primary) hypertension: Secondary | ICD-10-CM | POA: Insufficient documentation

## 2015-12-31 DIAGNOSIS — S62636B Displaced fracture of distal phalanx of right little finger, initial encounter for open fracture: Secondary | ICD-10-CM | POA: Insufficient documentation

## 2015-12-31 DIAGNOSIS — S61219A Laceration without foreign body of unspecified finger without damage to nail, initial encounter: Secondary | ICD-10-CM

## 2015-12-31 DIAGNOSIS — W231XXA Caught, crushed, jammed, or pinched between stationary objects, initial encounter: Secondary | ICD-10-CM | POA: Insufficient documentation

## 2015-12-31 DIAGNOSIS — S62639B Displaced fracture of distal phalanx of unspecified finger, initial encounter for open fracture: Secondary | ICD-10-CM

## 2015-12-31 DIAGNOSIS — Y92810 Car as the place of occurrence of the external cause: Secondary | ICD-10-CM | POA: Diagnosis not present

## 2015-12-31 DIAGNOSIS — Y990 Civilian activity done for income or pay: Secondary | ICD-10-CM | POA: Diagnosis not present

## 2015-12-31 MED ORDER — LIDOCAINE HCL (PF) 1 % IJ SOLN: 10.0000 mL | Freq: Once | INTRAMUSCULAR | Status: DC

## 2015-12-31 MED ORDER — BACITRACIN ZINC 500 UNIT/GM EX OINT
TOPICAL_OINTMENT | CUTANEOUS | Status: AC
  Administered 2015-12-31: 1
  Filled 2015-12-31: qty 0.9

## 2015-12-31 MED ORDER — CEPHALEXIN 500 MG PO CAPS: 500.0000 mg | ORAL_CAPSULE | Freq: Four times a day (QID) | ORAL | Status: DC

## 2015-12-31 MED ORDER — TETANUS-DIPHTH-ACELL PERTUSSIS 5-2.5-18.5 LF-MCG/0.5 IM SUSP
0.5000 mL | Freq: Once | INTRAMUSCULAR | Status: AC
  Administered 2015-12-31: 0.5 mL via INTRAMUSCULAR
  Filled 2015-12-31: qty 0.5

## 2015-12-31 MED ORDER — HYDROCODONE-ACETAMINOPHEN 5-325 MG PO TABS: 1.0000 | ORAL_TABLET | ORAL | Status: DC | PRN

## 2015-12-31 MED ORDER — OXYCODONE-ACETAMINOPHEN 5-325 MG PO TABS
2.0000 | ORAL_TABLET | Freq: Once | ORAL | Status: AC
  Administered 2015-12-31: 2 via ORAL
  Filled 2015-12-31: qty 2

## 2015-12-31 NOTE — ED Notes (Signed)
PA at bedside.

## 2015-12-31 NOTE — ED Provider Notes (Signed)
Since seen and evaluated. Discussed with PA. Please consulted Dr. Janee Mornhompson of hand surgery. He requests nonadherent dressing and follow-up with him in the next few days to evaluate for possible revision. I agree with this plan. On exam pt has avulsion/loss of nearly all volar soft tissues of his right fifth digit P3. There is no exposure of cancellous/or cortical bone.  Edwin PorterMark Rexton Greulich, MD 12/31/15 [phone]

## 2015-12-31 NOTE — Discharge Instructions (Signed)
Follow up with the Hand Surgeon in 2-3 days to have the wound rechecked.  His office will call you to schedule an appointment.

## 2015-12-31 NOTE — ED Provider Notes (Signed)
CSN: [id number]     Arrival date & time 12/31/15  0807 History   First MD Initiated Contact with Patient 12/31/15 [phone]     Chief Complaint  Patient presents with  . Finger Injury     (Consider location/radiation/quality/duration/timing/severity/associated sxs/prior Treatment) HPI Comments: Patient presents today with an injury to his right 5th digit.  He states that he was working on a car and set a cylinder for the car on his boot.  The cylinder rolled off of the boot and landed on his 5th finger, crushing the finger.  He has a large area of skin and tissue avulsion of the finger pad of the right 5th finger.  He has applied pressure to the wound.  Bleeding controlled.  He denies any numbness or tingling of the finger.  He is unsure of the date of his last tetanus.  He states that he is left handed.  The history is provided by the patient.    Past Medical History  Diagnosis Date  . Hypertension   . Hyperlipidemia   . Gout   . Obesity   . Hernia     bilateral inguinal  . Tobacco use    History reviewed. No pertinent past surgical history. Family History  Problem Relation Age of Onset  . Diabetes Mother   . Arthritis Mother     knees  . Cancer Father 82  . Birth defects Sister   . Diabetes Maternal Grandmother   . Dementia Maternal Grandmother   . Heart disease Maternal Grandfather   . Heart disease Paternal Grandmother    Social History  Substance Use Topics  . Smoking status: Current Every Day Smoker -- 2.00 packs/day    Types: Cigarettes  . Smokeless tobacco: None  . Alcohol Use: 4.8 oz/week    8 Cans of beer per week     Comment: 8 cans of beer on the weekend Saturday when off work call.    Review of Systems  All other systems reviewed and are negative.     Allergies  Review of patient's allergies indicates no known allergies.  Home Medications   Prior to Admission medications   Medication Sig Start Date End Date Taking? Authorizing Provider  albuterol  (PROAIR HFA) 108 (90 Base) MCG/ACT inhaler Inhale 2 puffs into the lungs every 4 (four) hours as needed for wheezing or shortness of breath. 09/03/15   Mechele Claude, MD  allopurinol (ZYLOPRIM) 300 MG tablet Take 1 tablet (300 mg total) by mouth daily. 09/03/15   Mechele Claude, MD  amLODipine (NORVASC) 5 MG tablet Take 1 tablet (5 mg total) by mouth daily. 09/03/15   Mechele Claude, MD  amoxicillin-clavulanate (AUGMENTIN) 875-125 MG tablet Take 1 tablet by mouth 2 (two) times daily. Take all of this medication 09/03/15   Mechele Claude, MD  atorvastatin (LIPITOR) 40 MG tablet Take 1 tablet (40 mg total) by mouth daily. 09/03/15   Mechele Claude, MD  colchicine 0.6 MG tablet Take 1 tablet (0.6 mg total) by mouth daily. 09/03/15   Mechele Claude, MD  colchicine-probenecid 0.5-500 MG tablet Take 1 tablet by mouth 2 (two) times daily. For prevention of gout attack 09/06/15   Mechele Claude, MD  lisinopril (PRINIVIL,ZESTRIL) 10 MG tablet TAKE ONE TABLET BY MOUTH ONCE DAILY 10/31/15   Mechele Claude, MD   BP 158/88 mmHg  Pulse 65  Temp(Src) 97.8 F (36.6 C) (Oral)  Resp 18  SpO2 97% Physical Exam  Constitutional: He appears well-developed and well-nourished.  HENT:  Head: Normocephalic and atraumatic.  Mouth/Throat: Oropharynx is clear and moist.  Neck: Normal range of motion. Neck supple.  Cardiovascular: Normal rate, regular rhythm and normal heart sounds.   Pulmonary/Chest: Effort normal and breath sounds normal.  Musculoskeletal: Normal range of motion.  Full ROM of the right 5th finger at the level of the MCP, PIP, and DIP  Neurological: He is alert.  Distal sensation of the right 5th digit intact  Skin: Skin is warm and dry.  Skin and soft tissue avulsion of the finger pad of the right 5th digit just distal to the distal interphalangeal crease.  Majority of the finger pad is affected  Psychiatric: He has a normal mood and affect.  Nursing note and vitals reviewed.   ED Course  Procedures  (including critical care time) Labs Review Labs Reviewed - No data to display  Imaging Review No results found. I have personally reviewed and evaluated these images and lab results as part of my medical decision-making.   EKG Interpretation None      MDM   Final diagnoses:  None   Patient presents today with an injury to the finger pad of the right 5th digit.  He has an avulsion of the soft tissue of the finger pad.  Unable to close the injury due to the amount of missing skin and tissue.  He also has an underlying comminuted fracture of the distal distal phalanx.  No bone exposed by the laceration.  He is neurovascularly intact.  No involvement of the tendon.  Patient discussed with Dr. Janee Mornhompson with Hand Surgery.  He recommends a Xeroform dressing over the raw portion of the wound and wrap with Coband.  He will follow up with the patient in the next few days for possible revision.  Tetanus updated in the ED.  Patient put on prophylactic Keflex.  Patient in agreement with plan.  Stable for discharge.  Return precautions given.      Santiago GladHeather Nikkia Devoss, PA-C 01/02/16 2020  Rolland PorterMark James, MD 01/14/16 [id number]

## 2015-12-31 NOTE — ED Notes (Signed)
Pt reports dropping steel cylinder on finger at work.

## 2016-02-25 ENCOUNTER — Encounter: Payer: Self-pay | Admitting: Family Medicine

## 2016-02-25 ENCOUNTER — Ambulatory Visit (INDEPENDENT_AMBULATORY_CARE_PROVIDER_SITE_OTHER): Payer: 59 | Admitting: Family Medicine

## 2016-02-25 VITALS — BP 126/72 | HR 67 | Temp 98.0°F | Ht 67.0 in | Wt 229.4 lb

## 2016-02-25 DIAGNOSIS — E785 Hyperlipidemia, unspecified: Secondary | ICD-10-CM | POA: Diagnosis not present

## 2016-02-25 DIAGNOSIS — E669 Obesity, unspecified: Secondary | ICD-10-CM

## 2016-02-25 DIAGNOSIS — F17200 Nicotine dependence, unspecified, uncomplicated: Secondary | ICD-10-CM | POA: Diagnosis not present

## 2016-02-25 DIAGNOSIS — I10 Essential (primary) hypertension: Secondary | ICD-10-CM

## 2016-02-25 DIAGNOSIS — J449 Chronic obstructive pulmonary disease, unspecified: Secondary | ICD-10-CM | POA: Diagnosis not present

## 2016-02-25 DIAGNOSIS — M109 Gout, unspecified: Secondary | ICD-10-CM | POA: Diagnosis not present

## 2016-02-25 MED ORDER — COLCHICINE 0.6 MG PO TABS: 0.6000 mg | ORAL_TABLET | Freq: Every day | ORAL | 2 refills | Status: DC

## 2016-02-25 MED ORDER — ALLOPURINOL 300 MG PO TABS: 300.0000 mg | ORAL_TABLET | Freq: Every day | ORAL | 5 refills | Status: DC

## 2016-02-25 MED ORDER — LISINOPRIL 10 MG PO TABS: 10.0000 mg | ORAL_TABLET | Freq: Every day | ORAL | 5 refills | Status: DC

## 2016-02-25 MED ORDER — ATORVASTATIN CALCIUM 40 MG PO TABS: 40.0000 mg | ORAL_TABLET | Freq: Every day | ORAL | 1 refills | Status: DC

## 2016-02-25 MED ORDER — AMLODIPINE BESYLATE 5 MG PO TABS: 5.0000 mg | ORAL_TABLET | Freq: Every day | ORAL | 5 refills | Status: DC

## 2016-02-25 NOTE — Progress Notes (Signed)
. Subjective:  Patient ID: Rayvion Stumph, male    DOB: 1960/11/10  Age: 55 y.o. MRN: [id number]  CC: Hypertension (4 mth rck); Gout; and Hyperlipidemia   HPI Yasseen Salls Serviss presents for  follow-up of hypertension. Patient has no history of headache chest pain or shortness of breath or recent cough. Patient also denies symptoms of TIA such as numbness weakness lateralizing. Patient checks  blood pressure at home and has not had any elevated readings recently. Patient denies side effects from his medication. States taking it regularly.  Patient also  in for follow-up of elevated cholesterol. Doing well without complaints on current medication. Denies side effects of statin including myalgia and arthralgia and nausea. Also in today for liver function testing. Currently no chest pain, shortness of breath or other cardiovascular related symptoms noted.  Also treated for gout.  No flares since last OV  Vapes made him cough his head off. Not ready to quit smoking. It calms his stress. Grandparents died of heart attacks 6 months after quitting smoking  History Alfonsa has a past medical history of Gout; Hernia; Hyperlipidemia; Hypertension; Obesity; and Tobacco use.   He has no past surgical history on file.   His family history includes Arthritis in his mother; Birth defects in his sister; Cancer (age of onset: 65) in his father; Dementia in his maternal grandmother; Diabetes in his maternal grandmother and mother; Heart disease in his maternal grandfather and paternal grandmother.He reports that he has been smoking Cigarettes.  He has been smoking about 2.00 packs per day. He does not have any smokeless tobacco history on file. He reports that he drinks about 4.8 oz of alcohol per week . He reports that he does not use drugs.  Current Outpatient Prescriptions on File Prior to Visit  Medication Sig Dispense Refill  . allopurinol (ZYLOPRIM) 300 MG tablet Take 1 tablet (300 mg total) by mouth  daily. 30 tablet 5  . amLODipine (NORVASC) 5 MG tablet Take 1 tablet (5 mg total) by mouth daily. 30 tablet 5  . atorvastatin (LIPITOR) 40 MG tablet Take 1 tablet (40 mg total) by mouth daily. 90 tablet 1  . colchicine 0.6 MG tablet Take 1 tablet (0.6 mg total) by mouth daily. 10 tablet 2  . lisinopril (PRINIVIL,ZESTRIL) 10 MG tablet TAKE ONE TABLET BY MOUTH ONCE DAILY 30 tablet 3  . albuterol (PROAIR HFA) 108 (90 Base) MCG/ACT inhaler Inhale 2 puffs into the lungs every 4 (four) hours as needed for wheezing or shortness of breath. (Patient not taking: Reported on 02/25/2016) 1 Inhaler 11   No current facility-administered medications on file prior to visit.     ROS Review of Systems  Constitutional: Negative for chills, diaphoresis, fever and unexpected weight change.  HENT: Negative for congestion, hearing loss, rhinorrhea and sore throat.   Eyes: Negative for visual disturbance.  Respiratory: Positive for shortness of breath (inhaler helps. Exacerbated by rushing in the heat). Negative for cough.   Cardiovascular: Negative for chest pain.  Gastrointestinal: Negative for abdominal pain, constipation and diarrhea.  Genitourinary: Negative for dysuria and flank pain.  Musculoskeletal: Positive for arthralgias (occasional ankle pain. Works on [employer], lots of bending & climbing). Negative for joint swelling.  Skin: Negative for rash.  Neurological: Negative for dizziness and headaches.  Psychiatric/Behavioral: Negative for dysphoric mood and sleep disturbance.    Objective:  BP 126/72 (BP Location: Left Arm, Patient Position: Sitting, Cuff Size: Normal)   Pulse 67   Temp 98 F (36.7  C) (Oral)   Ht '5\' 7"'$  (1.702 m)   Wt 229 lb 6.4 oz (104.1 kg)   SpO2 97%   BMI 35.93 kg/m   BP Readings from Last 3 Encounters:  02/25/16 126/72  12/31/15 132/88  09/03/15 140/75    Wt Readings from Last 3 Encounters:  02/25/16 229 lb 6.4 oz (104.1 kg)  09/03/15 225 lb 6.4 oz (102.2 kg)  05/21/15  216 lb 12.8 oz (98.3 kg)     Physical Exam  Constitutional: He is oriented to person, place, and time. He appears well-developed and well-nourished. No distress.  obese  HENT:  Head: Normocephalic and atraumatic.  Right Ear: External ear normal.  Left Ear: External ear normal.  Nose: Nose normal.  Mouth/Throat: Oropharynx is clear and moist.  Eyes: Conjunctivae and EOM are normal. Pupils are equal, round, and reactive to light.  Neck: Normal range of motion. Neck supple. No thyromegaly present.  Cardiovascular: Normal rate, regular rhythm and normal heart sounds.   No murmur heard. Pulmonary/Chest: Effort normal and breath sounds normal. No respiratory distress. He has no wheezes. He has no rales.  Abdominal: Soft. Bowel sounds are normal. He exhibits no distension. There is no tenderness.  Lymphadenopathy:    He has no cervical adenopathy.  Neurological: He is alert and oriented to person, place, and time. He has normal reflexes.  Skin: Skin is warm and dry.  Psychiatric: He has a normal mood and affect. His behavior is normal. Judgment and thought content normal.    No results found for: HGBA1C  Lab Results  Component Value Date   WBC 10.9 (A) 03/13/2013   HGB 16.1 03/13/2013   HCT 47.9 03/13/2013   GLUCOSE 136 (H) 09/03/2015   CHOL 158 09/03/2015   TRIG 229 (H) 09/03/2015   HDL 40 09/03/2015   LDLCALC 72 09/03/2015   ALT 11 09/03/2015   AST 16 09/03/2015   NA 139 09/03/2015   K 4.6 09/03/2015   CL 99 09/03/2015   CREATININE 1.31 (H) 09/03/2015   BUN 10 09/03/2015   CO2 25 09/03/2015   TSH 4.160 08/30/2014    Dg Finger Little Right  Result Date: 12/31/2015 CLINICAL DATA:  . EXAM: RIGHT LITTLE FINGER 2+V COMPARISON:  No recent prior. FINDINGS: Comminuted fracture with displaced fragments of the distal tuft of the right fifth digit. Soft tissue laceration with metallic foreign bodies noted in the distal tuft. IMPRESSION: 1. Comminuted fracture of the distal aspect  of the distal phalanx of the right fifth digit. Fracture fragments are displaced. 2.  Distal tuft soft tissue laceration with metallic foreign bodies. Electronically Signed   By: Marcello Moores  Register   On: 12/31/2015 08:47    Assessment & Plan:   Olyn was seen today for hypertension, gout and hyperlipidemia.  Diagnoses and all orders for this visit:  COPD mixed type ([place]) -     CBC with Differential/Platelet -     CMP14+EGFR  Essential hypertension -     CBC with Differential/Platelet -     CMP14+EGFR  Hyperlipemia -     CBC with Differential/Platelet -     CMP14+EGFR -     Lipid panel  Obesity, unspecified -     CBC with Differential/Platelet -     CMP14+EGFR  Tobacco use disorder -     CMP14+EGFR  Gout of multiple sites, unspecified cause, unspecified chronicity -     CBC with Differential/Platelet -     CMP14+EGFR -     Uric  acid  Pt. Actually feels smoking will prevent a heart attack. Unopen to discusssion of lifestyle changes.  Follow-up: Return in about 6 months (around 08/27/2016) for COPD, Wellness.  Claretta Fraise, M.D.

## 2016-02-25 NOTE — Addendum Note (Signed)
Addended by: Bearl Mulberry on: 02/25/2016 08:44 AM   Modules accepted: Orders

## 2016-02-26 LAB — CBC WITH DIFFERENTIAL/PLATELET
BASOS: 1 %
Basophils Absolute: 0.1 10*3/uL (ref 0.0–0.2)
EOS (ABSOLUTE): 0.2 10*3/uL (ref 0.0–0.4)
EOS: 2 %
HEMATOCRIT: 48.2 % (ref 37.5–51.0)
Hemoglobin: 16.2 g/dL (ref 12.6–17.7)
IMMATURE GRANULOCYTES: 1 %
Immature Grans (Abs): 0.1 10*3/uL (ref 0.0–0.1)
LYMPHS ABS: 2.7 10*3/uL (ref 0.7–3.1)
Lymphs: 23 %
MCH: 30.3 pg (ref 26.6–33.0)
MCHC: 33.6 g/dL (ref 31.5–35.7)
MCV: 90 fL (ref 79–97)
MONOCYTES: 7 %
MONOS ABS: 0.8 10*3/uL (ref 0.1–0.9)
Neutrophils Absolute: 7.7 10*3/uL — ABNORMAL HIGH (ref 1.4–7.0)
Neutrophils: 66 %
PLATELETS: 191 10*3/uL (ref 150–379)
RBC: 5.34 x10E6/uL (ref 4.14–5.80)
RDW: 13.6 % (ref 12.3–15.4)
WBC: 11.5 10*3/uL — ABNORMAL HIGH (ref 3.4–10.8)

## 2016-02-26 LAB — LIPID PANEL
CHOL/HDL RATIO: 4.8 ratio (ref 0.0–5.0)
Cholesterol, Total: 187 mg/dL (ref 100–199)
HDL: 39 mg/dL — AB (ref >39)
LDL CALC: 95 mg/dL (ref 0–99)
TRIGLYCERIDES: 266 mg/dL — AB (ref 0–149)
VLDL CHOLESTEROL CAL: 53 mg/dL — AB (ref 5–40)

## 2016-02-26 LAB — CMP14+EGFR
A/G RATIO: 1.8 (ref 1.2–2.2)
ALT: 13 IU/L (ref 0–44)
AST: 13 IU/L (ref 0–40)
Albumin: 4.4 g/dL (ref 3.5–5.5)
Alkaline Phosphatase: 83 IU/L (ref 39–117)
BUN/Creatinine Ratio: 9 (ref 9–20)
BUN: 11 mg/dL (ref 6–24)
Bilirubin Total: 0.4 mg/dL (ref 0.0–1.2)
CALCIUM: 9.5 mg/dL (ref 8.7–10.2)
CO2: 27 mmol/L (ref 18–29)
CREATININE: 1.28 mg/dL — AB (ref 0.76–1.27)
Chloride: 98 mmol/L (ref 96–106)
GFR, EST AFRICAN AMERICAN: 72 mL/min/{1.73_m2} (ref >59)
GFR, EST NON AFRICAN AMERICAN: 62 mL/min/{1.73_m2} (ref >59)
Globulin, Total: 2.5 g/dL (ref 1.5–4.5)
Glucose: 124 mg/dL — ABNORMAL HIGH (ref 65–99)
Potassium: 4.9 mmol/L (ref 3.5–5.2)
Sodium: 140 mmol/L (ref 134–144)
TOTAL PROTEIN: 6.9 g/dL (ref 6.0–8.5)

## 2016-02-26 LAB — URIC ACID: Uric Acid: 8.8 mg/dL — ABNORMAL HIGH (ref 3.7–8.6)

## 2016-02-27 NOTE — Addendum Note (Signed)
Addended by: Fawn Kirk on: 02/27/2016 12:01 PM   Modules accepted: Orders

## 2016-07-06 ENCOUNTER — Ambulatory Visit (INDEPENDENT_AMBULATORY_CARE_PROVIDER_SITE_OTHER): Payer: 59 | Admitting: Family Medicine

## 2016-07-06 ENCOUNTER — Ambulatory Visit: Payer: 59 | Admitting: Family Medicine

## 2016-07-06 ENCOUNTER — Encounter: Payer: Self-pay | Admitting: Family Medicine

## 2016-07-06 ENCOUNTER — Ambulatory Visit (INDEPENDENT_AMBULATORY_CARE_PROVIDER_SITE_OTHER): Payer: 59

## 2016-07-06 VITALS — BP 128/77 | HR 66 | Temp 97.1°F | Ht 67.0 in | Wt 222.2 lb

## 2016-07-06 DIAGNOSIS — M25521 Pain in right elbow: Secondary | ICD-10-CM | POA: Diagnosis not present

## 2016-07-06 MED ORDER — COLCHICINE 0.6 MG PO TABS: ORAL_TABLET | ORAL | 2 refills | Status: DC

## 2016-07-06 NOTE — Patient Instructions (Signed)
Great to meet you!  Try colchicine as prescribed for a flare, continue allopurinol. Take 2 colchicine once, 1 more 1 hour later (this may cause loose stools), Then 1 tab daily for 5-7 days.   We will call with x ray results when they are available.

## 2016-07-06 NOTE — Progress Notes (Signed)
   HPI  Patient presents today here with right elbow pain.  Patient states that 3 days ago he woke up with right elbow swelling, warmth, and pain. He states that over the last 2-3 days it has slowly improved. He has been taking Aleve. He also states that he's been taking some old amoxicillin which he was prescribed.  He has no history of surgery to the right elbow.  He states 4 years ago he jumped off of a large piece of construction equipment with 2 heavy wrenches in his right hand and hit his right elbow on the track while calming down. He was seen at that time and stated that he was told he had tennis elbow.  He denies fever, chills, sweats, malaise, or feeling ill at all.  He has good medication compliance with allopurinol, his typical gout flares in his great toe. He needs a refill of colchicine because his old colchicine is expired. He denies reinjury  PMH: Smoking status noted ROS: Per HPI  Objective: BP 128/77   Pulse 66   Temp 97.1 F (36.2 C) (Oral)   Ht 5\' 7"  (1.702 m)   Wt 222 lb 3.2 oz (100.8 kg)   BMI 34.80 kg/m  Gen: NAD, alert, cooperative with exam HEENT: NCAT CV: RRR, good S1/S2, no murmur Resp: CTABL, no wheezes, non-labored Ext: No edema, warm Neuro: Alert and oriented, No gross deficits  Right elbow without gross deformity, tenderness to palpation, or obvious swelling. No clear warmth compared to the left.   Assessment and plan:  # Right elbow pain. I think given the fact that his elbow was swollen, red, and tender without explanation the most likely etiology is gout. Recommended colchicine at flare dosing. Discussed NSAIDs if he would like, however I think his improvement so far as likely been due to NSAIDs not to amoxicillin.  Plain film with old injury to the right elbow to rule out posttraumatic arthritis with flare.    Orders Placed This Encounter  Procedures  . DG Elbow Complete Right    Standing Status:   Future    Number of  Occurrences:   1    Standing Expiration Date:   09/06/2017    Order Specific Question:   Reason for Exam (SYMPTOM  OR DIAGNOSIS REQUIRED)    Answer:   R elbow pain and swelling, injury 4 years ago    Order Specific Question:   Preferred imaging location?    Answer:   Internal    Meds ordered this encounter  Medications  . colchicine 0.6 MG tablet    Sig: For gout flare: 2 tabs once, 1 tab one hour later, then 1 tab daily for 5-7 days    Dispense:  10 tablet    Refill:  2    Murtis SinkSam Reynalda Canny, MD [facility] 07/06/2016, 5:09 PM

## 2016-07-08 ENCOUNTER — Telehealth: Payer: Self-pay | Admitting: Family Medicine

## 2016-07-08 ENCOUNTER — Other Ambulatory Visit: Payer: Self-pay | Admitting: Family Medicine

## 2016-07-08 DIAGNOSIS — M25521 Pain in right elbow: Secondary | ICD-10-CM

## 2016-07-08 MED ORDER — AMOXICILLIN 500 MG PO CAPS: 500.0000 mg | ORAL_CAPSULE | Freq: Three times a day (TID) | ORAL | 0 refills | Status: DC

## 2016-07-08 NOTE — Telephone Encounter (Signed)
Patient aware and verbalizes understanding. 

## 2016-07-08 NOTE — Telephone Encounter (Signed)
X-ray consistent with bursitis and possible cellulitis  Pt was taking amox and stated this was helping, although it did not appear clinically as cellulitis I am very willing to finish a course given unsure etiology.   Amox sent.   Murtis SinkSam Reniah Cottingham, MD [facility] 07/08/2016, 9:37 AM

## 2016-08-27 ENCOUNTER — Encounter: Payer: Self-pay | Admitting: Family Medicine

## 2016-08-27 ENCOUNTER — Ambulatory Visit (INDEPENDENT_AMBULATORY_CARE_PROVIDER_SITE_OTHER): Payer: 59

## 2016-08-27 ENCOUNTER — Ambulatory Visit (INDEPENDENT_AMBULATORY_CARE_PROVIDER_SITE_OTHER): Payer: 59 | Admitting: Family Medicine

## 2016-08-27 VITALS — BP 121/68 | HR 62 | Temp 97.2°F | Ht 67.0 in | Wt 224.0 lb

## 2016-08-27 DIAGNOSIS — I10 Essential (primary) hypertension: Secondary | ICD-10-CM | POA: Diagnosis not present

## 2016-08-27 DIAGNOSIS — J449 Chronic obstructive pulmonary disease, unspecified: Secondary | ICD-10-CM | POA: Diagnosis not present

## 2016-08-27 DIAGNOSIS — Z7689 Persons encountering health services in other specified circumstances: Secondary | ICD-10-CM | POA: Diagnosis not present

## 2016-08-27 DIAGNOSIS — F17200 Nicotine dependence, unspecified, uncomplicated: Secondary | ICD-10-CM | POA: Diagnosis not present

## 2016-08-27 DIAGNOSIS — E782 Mixed hyperlipidemia: Secondary | ICD-10-CM

## 2016-08-27 MED ORDER — AMOXICILLIN-POT CLAVULANATE 875-125 MG PO TABS: 1.0000 | ORAL_TABLET | Freq: Two times a day (BID) | ORAL | 0 refills | Status: DC

## 2016-08-27 NOTE — Progress Notes (Signed)
Subjective:  Patient ID: Edwin Taylor, male    DOB: 06-29-1961  Age: 56 y.o. MRN: [id number]  CC: COPD (pt here today for routine follow up)   HPI Edwin Taylor presents for  follow-up of hypertension. Patient has no history of headache chest pain or shortness of breath or recent cough. Patient also denies symptoms of TIA such as numbness weakness lateralizing. Patient checks  blood pressure at home and has not had any elevated readings recently. Patient denies side effects from his medication. States taking it regularly.   Patient i. He continues to use his n for follow-up of elevated cholesterol. Doing well without complaints on current medication. Denies side effects of statin including myalgia and arthralgia and nausea. Also in today for liver function testing. Currently no chest pain, shortness of breath or other cardiovascular related symptoms noted.  Patient denies any recent flares of gout. He continues allopurinol for prevention. History Edwin Taylor has a past medical history of Gout; Hernia; Hyperlipidemia; Hypertension; Obesity; and Tobacco use.   He has no past surgical history on file.   His family history includes Arthritis in his mother; Birth defects in his sister; Cancer (age of onset: 41) in his father; Dementia in his maternal grandmother; Diabetes in his maternal grandmother and mother; Heart disease in his maternal grandfather and paternal grandmother.He reports that he has been smoking Cigarettes.  He has been smoking about 2.00 packs per day. He has never used smokeless tobacco. He reports that he drinks about 4.8 oz of alcohol per week . He reports that he does not use drugs.    ROS Review of Systems  Constitutional: Negative for activity change, appetite change, chills, diaphoresis, fever and unexpected weight change.  HENT: Positive for congestion, postnasal drip, rhinorrhea and sinus pressure. Negative for ear discharge, ear pain, hearing loss, nosebleeds,  sneezing, sore throat and trouble swallowing.   Eyes: Negative for visual disturbance.  Respiratory: Positive for cough. Negative for chest tightness and shortness of breath.   Cardiovascular: Negative for chest pain and palpitations.  Gastrointestinal: Negative for abdominal pain, constipation and diarrhea.  Genitourinary: Negative for dysuria and flank pain.  Musculoskeletal: Negative for arthralgias and joint swelling.  Skin: Negative for rash.  Neurological: Negative for dizziness and headaches.  Psychiatric/Behavioral: Negative for dysphoric mood and sleep disturbance.    Objective:  BP 121/68   Pulse 62   Temp 97.2 F (36.2 C) (Oral)   Ht '5\' 7"'  (1.702 m)   Wt 224 lb (101.6 kg)   BMI 35.08 kg/m   BP Readings from Last 3 Encounters:  08/27/16 121/68  07/06/16 128/77  02/25/16 126/72    Wt Readings from Last 3 Encounters:  08/27/16 224 lb (101.6 kg)  07/06/16 222 lb 3.2 oz (100.8 kg)  02/25/16 229 lb 6.4 oz (104.1 kg)     Physical Exam  Constitutional: He is oriented to person, place, and time. He appears well-developed and well-nourished. No distress.  HENT:  Head: Normocephalic and atraumatic.  Right Ear: External ear normal.  Left Ear: External ear normal.  Nose: Nose normal.  Mouth/Throat: Oropharynx is clear and moist.  Eyes: Conjunctivae and EOM are normal. Pupils are equal, round, and reactive to light.  Neck: Normal range of motion. Neck supple. No thyromegaly present.  Cardiovascular: Normal rate, regular rhythm and normal heart sounds.   No murmur heard. Pulmonary/Chest: Effort normal and breath sounds normal. No respiratory distress. He has no wheezes. He has no rales.  Abdominal: Soft. Bowel  sounds are normal. He exhibits no distension. There is no tenderness.  Lymphadenopathy:    He has no cervical adenopathy.  Neurological: He is alert and oriented to person, place, and time. He has normal reflexes.  Skin: Skin is warm and dry.  Psychiatric: He  has a normal mood and affect. His behavior is normal. Judgment and thought content normal.    Dg Finger Little Right  Result Date: 12/31/2015 CLINICAL DATA:  . EXAM: RIGHT LITTLE FINGER 2+V COMPARISON:  No recent prior. FINDINGS: Comminuted fracture with displaced fragments of the distal tuft of the right fifth digit. Soft tissue laceration with metallic foreign bodies noted in the distal tuft. IMPRESSION: 1. Comminuted fracture of the distal aspect of the distal phalanx of the right fifth digit. Fracture fragments are displaced. 2.  Distal tuft soft tissue laceration with metallic foreign bodies. Electronically Signed   By: Marcello Moores  Register   On: 12/31/2015 08:47    Assessment & Plan:   Damare was seen today for copd.  Diagnoses and all orders for this visit:  Essential hypertension -     CBC with Differential/Platelet -     CMP14+EGFR  Mixed hyperlipidemia -     CBC with Differential/Platelet -     CMP14+EGFR -     Lipid panel  Tobacco use disorder -     CBC with Differential/Platelet -     CMP14+EGFR  COPD mixed type ([facility]) -     CBC with Differential/Platelet -     CMP14+EGFR -     DG Chest 2 View; Future  Other orders -     amoxicillin-clavulanate (AUGMENTIN) 875-125 MG tablet; Take 1 tablet by mouth 2 (two) times daily. Take all of this medication   I have discontinued Mr. Krouse's amoxicillin. I am also having him start on amoxicillin-clavulanate. Additionally, I am having him maintain his albuterol, allopurinol, amLODipine, atorvastatin, lisinopril, and colchicine.  Allergies as of 08/27/2016   No Known Allergies     Medication List       Accurate as of 08/27/16 11:59 PM. Always use your most recent med list.          albuterol 108 (90 Base) MCG/ACT inhaler Commonly known as:  PROAIR HFA Inhale 2 puffs into the lungs every 4 (four) hours as needed for wheezing or shortness of breath.   allopurinol 300 MG tablet Commonly known as:  ZYLOPRIM Take 1 tablet (300  mg total) by mouth daily.   amLODipine 5 MG tablet Commonly known as:  NORVASC Take 1 tablet (5 mg total) by mouth daily.   amoxicillin-clavulanate 875-125 MG tablet Commonly known as:  AUGMENTIN Take 1 tablet by mouth 2 (two) times daily. Take all of this medication   atorvastatin 40 MG tablet Commonly known as:  LIPITOR Take 1 tablet (40 mg total) by mouth daily.   colchicine 0.6 MG tablet For gout flare: 2 tabs once, 1 tab one hour later, then 1 tab daily for 5-7 days   lisinopril 10 MG tablet Commonly known as:  PRINIVIL,ZESTRIL Take 1 tablet (10 mg total) by mouth daily.        Follow-up: Return in about 6 months (around 02/24/2017) for Wellness, hypertension, cholesterol, COPD.  Claretta Fraise, M.D.

## 2016-08-28 LAB — CBC WITH DIFFERENTIAL/PLATELET
BASOS: 1 %
Basophils Absolute: 0.1 10*3/uL (ref 0.0–0.2)
EOS (ABSOLUTE): 0.2 10*3/uL (ref 0.0–0.4)
EOS: 2 %
Hematocrit: 48.2 % (ref 37.5–51.0)
Hemoglobin: 16.1 g/dL (ref 13.0–17.7)
IMMATURE GRANULOCYTES: 1 %
Immature Grans (Abs): 0.1 10*3/uL (ref 0.0–0.1)
Lymphocytes Absolute: 2.2 10*3/uL (ref 0.7–3.1)
Lymphs: 19 %
MCH: 30.2 pg (ref 26.6–33.0)
MCHC: 33.4 g/dL (ref 31.5–35.7)
MCV: 90 fL (ref 79–97)
MONOCYTES: 6 %
MONOS ABS: 0.8 10*3/uL (ref 0.1–0.9)
NEUTROS PCT: 71 %
Neutrophils Absolute: 8.3 10*3/uL — ABNORMAL HIGH (ref 1.4–7.0)
PLATELETS: 201 10*3/uL (ref 150–379)
RBC: 5.33 x10E6/uL (ref 4.14–5.80)
RDW: 13.6 % (ref 12.3–15.4)
WBC: 11.7 10*3/uL — AB (ref 3.4–10.8)

## 2016-08-28 LAB — LIPID PANEL
CHOL/HDL RATIO: 4.2 ratio (ref 0.0–5.0)
Cholesterol, Total: 142 mg/dL (ref 100–199)
HDL: 34 mg/dL — ABNORMAL LOW (ref >39)
LDL Calculated: 70 mg/dL (ref 0–99)
TRIGLYCERIDES: 188 mg/dL — AB (ref 0–149)
VLDL Cholesterol Cal: 38 mg/dL (ref 5–40)

## 2016-08-28 LAB — CMP14+EGFR
A/G RATIO: 1.8 (ref 1.2–2.2)
ALT: 12 IU/L (ref 0–44)
AST: 14 IU/L (ref 0–40)
Albumin: 4.4 g/dL (ref 3.5–5.5)
Alkaline Phosphatase: 96 IU/L (ref 39–117)
BUN/Creatinine Ratio: 10 (ref 9–20)
BUN: 12 mg/dL (ref 6–24)
Bilirubin Total: 0.3 mg/dL (ref 0.0–1.2)
CALCIUM: 8.9 mg/dL (ref 8.7–10.2)
CO2: 24 mmol/L (ref 18–29)
Chloride: 100 mmol/L (ref 96–106)
Creatinine, Ser: 1.21 mg/dL (ref 0.76–1.27)
GFR, EST AFRICAN AMERICAN: 77 mL/min/{1.73_m2} (ref >59)
GFR, EST NON AFRICAN AMERICAN: 67 mL/min/{1.73_m2} (ref >59)
Globulin, Total: 2.5 g/dL (ref 1.5–4.5)
Glucose: 123 mg/dL — ABNORMAL HIGH (ref 65–99)
POTASSIUM: 4 mmol/L (ref 3.5–5.2)
Sodium: 141 mmol/L (ref 134–144)
TOTAL PROTEIN: 6.9 g/dL (ref 6.0–8.5)

## 2016-08-31 ENCOUNTER — Other Ambulatory Visit: Payer: Self-pay | Admitting: *Deleted

## 2016-08-31 DIAGNOSIS — R7309 Other abnormal glucose: Secondary | ICD-10-CM

## 2016-09-02 LAB — SPECIMEN STATUS REPORT

## 2016-09-02 LAB — HGB A1C W/O EAG: Hgb A1c MFr Bld: 6.2 % — ABNORMAL HIGH (ref 4.8–5.6)

## 2016-10-20 ENCOUNTER — Ambulatory Visit (INDEPENDENT_AMBULATORY_CARE_PROVIDER_SITE_OTHER): Payer: 59 | Admitting: Family Medicine

## 2016-10-20 ENCOUNTER — Encounter: Payer: Self-pay | Admitting: Family Medicine

## 2016-10-20 ENCOUNTER — Encounter (INDEPENDENT_AMBULATORY_CARE_PROVIDER_SITE_OTHER): Payer: Self-pay

## 2016-10-20 VITALS — BP 136/82 | HR 65 | Temp 97.0°F | Ht 67.0 in | Wt 221.4 lb

## 2016-10-20 DIAGNOSIS — M62830 Muscle spasm of back: Secondary | ICD-10-CM | POA: Diagnosis not present

## 2016-10-20 MED ORDER — CYCLOBENZAPRINE HCL 10 MG PO TABS: 10.0000 mg | ORAL_TABLET | Freq: Three times a day (TID) | ORAL | 0 refills | Status: DC | PRN

## 2016-10-20 MED ORDER — PREDNISONE 20 MG PO TABS
40.0000 mg | ORAL_TABLET | Freq: Every day | ORAL | 0 refills | Status: DC
Start: 2016-10-20 — End: 2016-11-09

## 2016-10-20 NOTE — Patient Instructions (Addendum)
Great to see you!  Come back if you do not get better or if you have any additional concerns  Start prednisone this morning, this will make your blood sugars higher for about 1-2 weeks.   Flexeril is a muscle relaxer, it may make you drowsy. Do not take it at work.   I recommend seeing our clinical pharmacist to discuss Pre-diabetes.

## 2016-10-20 NOTE — Progress Notes (Signed)
   HPI  Patient presents today here with back pain.  Patient explains her last 4 days he's had right-sided mid back pain. It hurts with bending and lifting. It hurts with twisting and rubbing the area. He has helped some, Aleve helped minimally.  Patient states that he works on [employer] equipment and lifts heavy objects consistently.  This began Friday after work and felt like a usual tired back. However Saturday when he woke up he continued to have the same achy right-sided low back pain. He denies any leg symptoms. Sunday persisted, he had a difficult time working yesterday.  Denies leg weakness, like symptoms.   PMH: Smoking status noted ROS: Per HPI  Objective: BP 136/82   Pulse 65   Temp 97 F (36.1 C) (Oral)   Ht 5\' 7"  (1.702 m)   Wt 221 lb 6.4 oz (100.4 kg)   BMI 34.68 kg/m  Gen: NAD, alert, cooperative with exam HEENT: NCAT CV: RRR, good S1/S2, no murmur Resp: CTABL, no wheezes, non-labored Ext: No edema, warm Neuro: Alert and oriented, 1+ symmetric patellar tendon reflexes Strength 5/5 in BL LE  MSK: TTP of R sided paraspinal muscles in upper lumbar/lower thoracic area NO midline tenderness   Assessment and plan:  # Muscle spasm back Only mild improvement with a good dose of NSAIDs, I treated him with prednisone 5 days as well as Flexeril. 2 days out of work On review of labs patient does have prediabetes, recommended talking to our clinical pharmacist about diabetic diet. Also I discussed the detrimental effects of prednisone on his blood sugars Return to clinic with any concerns or worsening symptoms. No signs of sciatica or neurologic involvement.     Meds ordered this encounter  Medications  . cyclobenzaprine (FLEXERIL) 10 MG tablet    Sig: Take 1 tablet (10 mg total) by mouth 3 (three) times daily as needed for muscle spasms.    Dispense:  30 tablet    Refill:  0  . predniSONE (DELTASONE) 20 MG tablet    Sig: Take 2 tablets (40 mg total)  by mouth daily with breakfast.    Dispense:  10 tablet    Refill:  0    Murtis SinkSam Danaria Larsen, MD [facility] 10/20/2016, 11:17 AM

## 2016-11-09 ENCOUNTER — Encounter: Payer: Self-pay | Admitting: Pharmacist

## 2016-11-09 ENCOUNTER — Ambulatory Visit (INDEPENDENT_AMBULATORY_CARE_PROVIDER_SITE_OTHER): Payer: 59 | Admitting: Pharmacist

## 2016-11-09 VITALS — Ht 67.0 in | Wt 219.5 lb

## 2016-11-09 DIAGNOSIS — R358 Other polyuria: Secondary | ICD-10-CM

## 2016-11-09 DIAGNOSIS — R7303 Prediabetes: Secondary | ICD-10-CM

## 2016-11-09 DIAGNOSIS — R3589 Other polyuria: Secondary | ICD-10-CM

## 2016-11-09 LAB — BAYER DCA HB A1C WAIVED: HB A1C: 6.3 % (ref <7.0)

## 2016-11-09 LAB — GLUCOSE HEMOCUE WAIVED: GLU HEMOCUE WAIVED: 151 mg/dL — AB (ref 65–99)

## 2016-11-09 NOTE — Patient Instructions (Signed)
Prediabetes Many people have heard about type 2 diabetes, but its common precursor, prediabetes, doesn't get as much attention. Prediabetes is estimated by CDC to affect 86 million Americans (this includes 51% of people 65 years and older), and an estimated 90% of people with prediabetes don't even know it. According to the CDC, 15-30% of these individuals will develop type 2 diabetes within five years. In other words, as many as 26 million people that currently have prediabetes could develop type 2 diabetes by 2020, effectively doubling the number of people with type 2 diabetes in the [place].  What is prediabetes? Prediabetes is a condition where blood sugar levels are higher than normal, but not high enough to be diagnosed as type 2 diabetes. This occurs when the body has problems in processing glucose properly, and sugar starts to build up in the bloodstream instead of fueling cells in muscles and tissues. Insulin is the hormone that tells cells to take up glucose, and in prediabetes, people typically initially develop insulin resistance (where the body's cells can't respond to insulin as well), and over time (if no actions are taken to reverse the situation) the ability to produce sufficient insulin is reduced. People with prediabetes also commonly have high blood pressure as well as abnormal blood lipids (e.g. cholesterol). These often occur prior to the rise of blood glucose levels.  What are the symptoms of prediabetes? People typically do not have symptoms of prediabetes, which is partially why up to 90% of people don't know they have it. The ADA reports that some people with prediabetes may develop symptoms of type 2 diabetes, though even many people diagnosed with type 2 diabetes show little or no symptoms initially at diagnosis.  How is prediabetes diagnosed? According to the American Diabetes Association, prediabetes can be diagnosed through one of the following tests: 1. A glycated hemoglobin  test, also known as HbA1c or simply A1c, gives an idea of the body's average blood sugar levels from the past two or three months. It is usually done with a small drop of blood from a fingerstick or as part of having blood taken in a doctor's office, hospital, or laboratory. A1c Level Diagnosis  Less than 5.7% Normal  5.7% to 6.4% Prediabetes  6.5% and higher Diabetes  2. A fasting plasma glucose (FPG) test measures a person's blood glucose level after fasting (not eating) for eight hours - this is typically done in the morning. If a test shows positive for prediabetes, a second test should be taken on a different day to confirm the diagnosis. FPG Level Diagnosis  Less than 100 mg/dl Normal  100 mg/dl to 125 mg/dl Prediabetes  126 mg/dl and higher Diabetes   Who is at risk of developing prediabetes? A well-known paper published in the Lancet in 2010 recommends screening for type 2 diabetes (which would also screen for prediabetes) every 3-5 years in all adults over the age of 45, regardless of other risk factors. Overweight and obese adults (a BMI >25 kg/m2) are also at significantly greater risk for developing prediabetes, as well as people with a family history of type 2 diabetes. According to the CDC, several other factors can have moderate influences on prediabetes risk in addition to age, weight, and family history: People with an African American, Hispanic/Latino, American Indian, Asian American, or Pacific Islander racial or ethnic background. The 2015 ADA Standards of Medical Care recommendations suggest Asian Americans with a BMI of 23 or above be screened for type 2 diabetes.    Women with a history of diabetes during pregnancy ("gestational diabetes") or have given birth to a baby weighing nine pounds or more. People who are physically active fewer than three times a week. The CDC offers a fast, online screening test for evaluating the risk for prediabetes. The ADA also offers a screening  test to assess type 2 diabetes risk. Of course, these tests do not themselves confirm a prediabetes diagnosis, but just if someone may be at higher risk of developing it.  Why do people develop prediabetes? Prediabetes develops through a combination of factors that are still being investigated. For sure, lifestyle factors (food, exercise, stress, sleep) play a role, but family history and genetics certainly do as well. It is easy to assume that prediabetes is the result of being overweight, but the relationship is not that simple. While obesity is one underlying cause of insulin resistance, many overweight individuals may never develop prediabetes or type 2 diabetes, and a minority of people with prediabetes have never been overweight. To make matters worse, it can be increasingly difficult to make healthy choices in today's toxic food environment that steers all of us to make the wrong food choices, and there are many factors that can contribute to weight gain in addition to diet.  Is a prediabetes diagnosis serious? There has been significant debate around the term 'prediabetes,' and whether it should be considered cause for alarm. On the one hand, it serves as a risk factor for type 2 diabetes and a host of other complications, including heart disease, and ultimately prediabetes implies that a degree of metabolic problems have started to occur in the body. On the other hand, it places a diagnosis on many people who may never develop type 2 diabetes. Again, according to the CDC, 15-30% of those with prediabetes will develop type 2 diabetes within five years. However, a 2012 Lancet article cites 5-10% of those with prediabetes each year will also revert back to healthy blood sugars. What's critical is not necessarily the cutoff itself, but where someone falls within the ranges listed above. The level of risk of developing type 2 diabetes is closely related to A1c or FPG at diagnosis. Those in the higher ranges  (A1c closer to 6.4%, FPG closer to 125 mg/dl) are much more likely to progress to type 2 diabetes, whereas those at lower ranges (A1c closer to 5.7%, FPG closer to 100 mg/dl) are relatively more likely to revert back to normal glucose levels or stay within the prediabetes range. Age of diagnosis and the level of insulin production still occurring at diagnosis also impact the chances of reverting to normoglycemia (normal blood sugar levels).  What can people with prediabetes do to avoid the progression from prediabetes to type 2 diabetes? The most important action people diagnosed with prediabetes can take is to focus on living a healthy lifestyle. This includes making healthy food choices, controlling portions, and increasing physical activity. Regarding weight control, research shows losing 5-7% (often about 10-20 lbs.) from your initial body weight and keeping off as much of that weight over time as possible is critical to lowering the risk of type 2 diabetes. This task is of course easier said than done, but sustained weight loss over time can be key to improving health and delaying or preventing the onset of type 2 diabetes. Several prediabetes interventions exist based on evidence from the landmark Diabetes Prevention Program (DPP) study. The DPP study reported that moderate weight loss (5-7% of body weight, or ~10-15 lbs.   for someone weighing 200 lbs.), counseling, and education on healthy eating and behavior reduced the risk of developing type 2 diabetes by 58%. Data presented at the ADA 2014 conference showed that after 15 years of follow-up of the DPP study groups, the results were still encouraging: 27% of those in the original lifestyle group had a significant reduction in type 2 diabetes progression compared to the control group. If you or someone you know has been told they have prediabetes, here are a few helpful resources: In-person diabetes prevention programs: The CDC offers a one year long  lifestyle change program through its National Diabetes Prevention Program (NDPP) at various locations throughout the [place] to help participants adopt healthy habits and prevent or delay progression to type 2 diabetes. This program is a major undertaking by the CDC to translate the findings from the DPP study into a real world setting, a significant effort indeed! Online diabetes prevention programs: The CDC has now given pending recognition status to three digital prevention programs: [employer] [facility], [facility], and [facility] [facility]. These offer the same one year long educational curriculum as the DPP study, but in an online format. Some insurance companies and employers cover these programs, and you can find more information at the links above. These digital versions are excellent options for those who live far away from NDPP locations or who prefer the anonymity and convenience of doing the program online. Metformin: The DPP study found that metformin, the safest first-line therapy for type 2 diabetes, may help delay the onset of type 2 diabetes in people with prediabetes. Participants who took the low-cost generic drug had a 31% reduced risk of developing type 2 diabetes compared to the control group (those not on metformin or intensive lifestyle intervention). Again, 15-year follow up data showed that 17% of those on metformin continued to have a significant reduction in type 2 progression. At this time, metformin (or any other medication, for that matter) is not currently FDA approved for prediabetes, and it is sometimes prescribed "off-label" by a healthcare provider. Your healthcare provider can give you more information and determine whether metformin is a good option for you.  Can prediabetes be "cured"? In the early stages of prediabetes (and type 2 diabetes), diligent attention to food choices and activity, and most importantly weight loss, can improve blood sugar numbers, effectively "reversing" the disease  and reducing the odds of developing type 2 diabetes. However, some people may have underlying factors (such as family history and genetics) that put them at a greater risk of type 2 diabetes, meaning they will always require careful attention to blood sugar levels and lifestyle choices. Returning to old habits will likely put someone back on the road to prediabetes, and eventually, type 2 diabetes   

## 2016-11-09 NOTE — Progress Notes (Signed)
Patient ID: Edwin Taylor, male   DOB: 05-16-1961, 56 y.o.   MRN: [id number]   Subjective:    Edwin Taylor is a 56 y.o. male who presents for an initial evaluation of pre-diabetes.   Edwin Taylor was noted to have elevated BG 08/27/2016 and A1c was added.  A1c was 6.2%.  Edwin Taylor was in office for muscle spasms 10/22/2016 and prednisone was prescribed for 5 days.  There was concern that this might increase his BG and Dr Ermalinda Memos referred patient to see me.  Current s/s of hyperglycemia:  Pt reports increased urination; no change in thirst, no blurry vision, no N/V  Weight trend: stable Prior visit with CDE: no Current diet: in general, an "unhealthy" diet.  Skips breakfast most days and sometimes skips lunch also.  When he eats lunch its usually [employer] - hamburger, FF and kids sized frosty.  Evening meal is usually meat + vegetable - corn, peas, green beans, potatoes.  Sometimes has pasta.  Drinks 8 to 10 regular Pepsi sodas a day. Drinks about 18 cans of light beer on weekends Current exercise: none Medication Compliance?  Yes  Current monitoring regimen: none  Cardiovascular risk factors: advanced age (older than 82 for men, 69 for women), dyslipidemia, family history of premature cardiovascular disease, hypertension, male gender, obesity (BMI >= 30 kg/m2), sedentary lifestyle and smoking/ tobacco exposure   Current Outpatient Prescriptions:  .  albuterol (PROAIR HFA) 108 (90 Base) MCG/ACT inhaler, Inhale 2 puffs into the lungs every 4 (four) hours as needed for wheezing or shortness of breath., Disp: 1 Inhaler, Rfl: 11 .  allopurinol (ZYLOPRIM) 300 MG tablet, Take 1 tablet (300 mg total) by mouth daily., Disp: 30 tablet, Rfl: 5 .  amLODipine (NORVASC) 5 MG tablet, Take 1 tablet (5 mg total) by mouth daily., Disp: 30 tablet, Rfl: 5 .  atorvastatin (LIPITOR) 40 MG tablet, Take 1 tablet (40 mg total) by mouth daily., Disp: 90 tablet, Rfl: 1 .  colchicine 0.6 MG tablet, For gout flare: 2  tabs once, 1 tab one hour later, then 1 tab daily for 5-7 days, Disp: 10 tablet, Rfl: 2 .  cyclobenzaprine (FLEXERIL) 10 MG tablet, Take 1 tablet (10 mg total) by mouth 3 (three) times daily as needed for muscle spasms., Disp: 30 tablet, Rfl: 0 .  lisinopril (PRINIVIL,ZESTRIL) 10 MG tablet, Take 1 tablet (10 mg total) by mouth daily., Disp: 30 tablet, Rfl: 5  The following portions of the patient's history were reviewed and updated as appropriate: allergies, current medications, past family history, past medical history, past social history, past surgical history and problem list.    Objective:    Ht  (1.702 m)   Wt 219 lb 8 oz (99.6 kg)   BMI 34.38 kg/m   Lab Review Glucose (mg/dL)  Date Value  40/98/1191 123 (H)  02/25/2016 124 (H)  09/03/2015 136 (H)   CO2 (mmol/L)  Date Value  08/27/2016 24  02/25/2016 27  09/03/2015 25   BUN (mg/dL)  Date Value  47/82/9562 12  02/25/2016 11  09/03/2015 10   Creatinine, Ser (mg/dL)  Date Value  13/03/6577 1.21  02/25/2016 1.28 (H)  09/03/2015 1.31 (H)   A1c today was 6.3% and RBG was 151    Assessment:      Plan:    1.  Rx changes: none 2.  Education: discussed pre diabetes and his risk of developing DM especially with family history of DM 3. Discussed best ways to prevent  DM are - weight loss (goal of 7 to 10% of current BW), exercise - goal of 150 minutes per week and dietary changed 4. CHO counting diet discussed.  Reviewed CHO amount in various foods and how to read nutrition labels.  Discussed recommended serving sizes, increasing non starchy vegetables and lean proteins.   5.  Recommended increase physical activity - goal is 150 minutes per week 6. Follow up: 3 months with PCP

## 2017-02-24 ENCOUNTER — Ambulatory Visit (INDEPENDENT_AMBULATORY_CARE_PROVIDER_SITE_OTHER): Payer: 59 | Admitting: Family Medicine

## 2017-02-24 ENCOUNTER — Encounter: Payer: Self-pay | Admitting: Family Medicine

## 2017-02-24 VITALS — BP 131/72 | HR 64 | Temp 97.3°F | Ht 67.0 in | Wt 216.0 lb

## 2017-02-24 DIAGNOSIS — Z0000 Encounter for general adult medical examination without abnormal findings: Secondary | ICD-10-CM

## 2017-02-24 DIAGNOSIS — R7303 Prediabetes: Secondary | ICD-10-CM | POA: Diagnosis not present

## 2017-02-24 DIAGNOSIS — I10 Essential (primary) hypertension: Secondary | ICD-10-CM | POA: Diagnosis not present

## 2017-02-24 DIAGNOSIS — M109 Gout, unspecified: Secondary | ICD-10-CM | POA: Diagnosis not present

## 2017-02-24 DIAGNOSIS — J449 Chronic obstructive pulmonary disease, unspecified: Secondary | ICD-10-CM

## 2017-02-24 DIAGNOSIS — F17200 Nicotine dependence, unspecified, uncomplicated: Secondary | ICD-10-CM

## 2017-02-24 DIAGNOSIS — E782 Mixed hyperlipidemia: Secondary | ICD-10-CM | POA: Diagnosis not present

## 2017-02-24 LAB — URINALYSIS
Bilirubin, UA: NEGATIVE
Glucose, UA: NEGATIVE
KETONES UA: NEGATIVE
Leukocytes, UA: NEGATIVE
NITRITE UA: NEGATIVE
Protein, UA: NEGATIVE
RBC, UA: NEGATIVE
Specific Gravity, UA: 1.01 (ref 1.005–1.030)
UUROB: 0.2 mg/dL (ref 0.2–1.0)
pH, UA: 6 (ref 5.0–7.5)

## 2017-02-24 LAB — BAYER DCA HB A1C WAIVED: HB A1C (BAYER DCA - WAIVED): 5.9 % (ref <7.0)

## 2017-02-24 MED ORDER — FLUTICASONE FUROATE-VILANTEROL 200-25 MCG/INH IN AEPB: 1.0000 | INHALATION_SPRAY | Freq: Every day | RESPIRATORY_TRACT | 11 refills | Status: DC

## 2017-02-24 MED ORDER — ALLOPURINOL 300 MG PO TABS: 300.0000 mg | ORAL_TABLET | Freq: Every day | ORAL | 1 refills | Status: DC

## 2017-02-24 MED ORDER — ALBUTEROL SULFATE HFA 108 (90 BASE) MCG/ACT IN AERS: 2.0000 | INHALATION_SPRAY | RESPIRATORY_TRACT | 11 refills | Status: DC | PRN

## 2017-02-24 MED ORDER — AMLODIPINE BESYLATE 5 MG PO TABS: 5.0000 mg | ORAL_TABLET | Freq: Every day | ORAL | 1 refills | Status: DC

## 2017-02-24 MED ORDER — ATORVASTATIN CALCIUM 40 MG PO TABS: 40.0000 mg | ORAL_TABLET | Freq: Every day | ORAL | 1 refills | Status: DC

## 2017-02-24 MED ORDER — COLCHICINE 0.6 MG PO TABS: ORAL_TABLET | ORAL | 2 refills | Status: DC

## 2017-02-24 MED ORDER — LISINOPRIL 10 MG PO TABS: 10.0000 mg | ORAL_TABLET | Freq: Every day | ORAL | 1 refills | Status: DC

## 2017-02-24 NOTE — Progress Notes (Signed)
Subjective:  Patient ID: Edwin Taylor, male    DOB: [birth year]  Age: 56 y.o. MRN: [id number]  CC: Annual Exam (pt here today for CPE)   HPI [name] presents for CPE. Having daily DOE for walking over 100 yards at a time. Still smoking. Feels it helps hhim calm down and avoid blowing up when he gets "pissed off."  Depression screen [facility] 2/9 02/24/2017 10/20/2016 08/27/2016  Decreased Interest 0 0 0  Down, Depressed, Hopeless 0 0 0  PHQ - 2 Score 0 0 0    History [name] has a past medical history of Gout; Hernia; Hyperlipidemia; Hypertension; Obesity; and Tobacco use.   He has no past surgical history on file.   His family history includes Arthritis in his mother; Birth defects in his sister; Cancer (age of onset: 19) in his father; Dementia in his maternal grandmother; Diabetes in his father, maternal grandfather, maternal grandmother, and paternal grandmother; Heart disease in his maternal grandfather and paternal grandmother.He reports that he has been smoking Cigarettes.  He has been smoking about 2.00 packs per day. He has never used smokeless tobacco. He reports that he drinks about 10.8 oz of alcohol per week . He reports that he does not use drugs.    ROS Review of Systems  Constitutional: Negative for activity change, appetite change, chills, diaphoresis, fatigue, fever and unexpected weight change.  HENT: Negative for congestion, ear pain, hearing loss, postnasal drip, rhinorrhea, sore throat, tinnitus and trouble swallowing.   Eyes: Negative for photophobia, pain, discharge and redness.  Respiratory: Positive for shortness of breath. Negative for apnea, cough, choking, chest tightness, wheezing and stridor.   Cardiovascular: Negative for chest pain, palpitations and leg swelling.  Gastrointestinal: Negative for abdominal distention, abdominal pain, blood in stool, constipation, diarrhea, nausea and vomiting.  Endocrine: Negative for cold intolerance, heat  intolerance, polydipsia, polyphagia and polyuria.  Genitourinary: Negative for difficulty urinating, dysuria, enuresis, flank pain, frequency, genital sores, hematuria and urgency.  Musculoskeletal: Negative for arthralgias and joint swelling.  Skin: Negative for color change, rash and wound.  Allergic/Immunologic: Negative for immunocompromised state.  Neurological: Negative for dizziness, tremors, seizures, syncope, facial asymmetry, speech difficulty, weakness, light-headedness, numbness and headaches.  Hematological: Does not bruise/bleed easily.  Psychiatric/Behavioral: Negative for agitation, behavioral problems, confusion, decreased concentration, dysphoric mood, hallucinations, sleep disturbance and suicidal ideas. The patient is not nervous/anxious and is not hyperactive.     Objective:  BP 131/72   Pulse 64   Temp (!) 97.3 F (36.3 C) (Oral)   Ht _0  (1.702 m)   Wt 216 lb (98 kg)   BMI 33.83 kg/m   BP Readings from Last 3 Encounters:  02/24/17 131/72  10/20/16 136/82  08/27/16 121/68    Wt Readings from Last 3 Encounters:  02/24/17 216 lb (98 kg)  11/09/16 219 lb 8 oz (99.6 kg)  10/20/16 221 lb 6.4 oz (100.4 kg)     Physical Exam  Constitutional: He is oriented to person, place, and time. He appears well-developed and well-nourished.  HENT:  Head: Normocephalic and atraumatic.  Mouth/Throat: Oropharynx is clear and moist.  Eyes: Pupils are equal, round, and reactive to light. EOM are normal.  Neck: Normal range of motion. No tracheal deviation present. No thyromegaly present.  Cardiovascular: Normal rate, regular rhythm and normal heart sounds.  Exam reveals no gallop and no friction rub.   No murmur heard. Pulmonary/Chest: Breath sounds normal. He has no wheezes. He has no rales.  Abdominal:  Soft. He exhibits no mass. There is no tenderness.  Musculoskeletal: Normal range of motion. He exhibits no edema.  Neurological: He is alert and oriented to person,  place, and time.  Skin: Skin is warm and dry.  Psychiatric: He has a normal mood and affect.      Assessment & Plan:   Rebekah was seen today for annual exam.  Diagnoses and all orders for this visit:  Well adult exam -     CBC with Differential/Platelet -     CMP14+EGFR -     PSA Total (Reflex To Free)  Gout of multiple sites, unspecified cause, unspecified chronicity -     CBC with Differential/Platelet -     CMP14+EGFR -     Urinalysis  Essential hypertension -     CBC with Differential/Platelet -     CMP14+EGFR -     Urinalysis  Mixed hyperlipidemia -     CBC with Differential/Platelet -     CMP14+EGFR -     Lipid panel -     Urinalysis  Tobacco use disorder -     CBC with Differential/Platelet -     CMP14+EGFR  COPD mixed type (HCC) -     CBC with Differential/Platelet -     CMP14+EGFR -     PR BREATHING CAPACITY TEST  Pre-diabetes -     CMP14+EGFR -     Urinalysis -     Bayer DCA Hb A1c Waived  Other orders -     albuterol (PROAIR HFA) 108 (90 Base) MCG/ACT inhaler; Inhale 2 puffs into the lungs every 4 (four) hours as needed for wheezing or shortness of breath. -     allopurinol (ZYLOPRIM) 300 MG tablet; Take 1 tablet (300 mg total) by mouth daily. -     amLODipine (NORVASC) 5 MG tablet; Take 1 tablet (5 mg total) by mouth daily. -     atorvastatin (LIPITOR) 40 MG tablet; Take 1 tablet (40 mg total) by mouth daily. -     colchicine 0.6 MG tablet; For gout flare: 2 tabs once, 1 tab one hour later, then 1 tab daily for 5-7 days -     lisinopril (PRINIVIL,ZESTRIL) 10 MG tablet; Take 1 tablet (10 mg total) by mouth daily. -     fluticasone furoate-vilanterol (BREO ELLIPTA) 200-25 MCG/INH AEPB; Inhale 1 puff into the lungs daily.       I have discontinued Mr. Halsted's cyclobenzaprine. I am also having him start on fluticasone furoate-vilanterol. Additionally, I am having him maintain his albuterol, allopurinol, amLODipine, atorvastatin, colchicine, and  lisinopril.  Allergies as of 02/24/2017   No Known Allergies     Medication List       Accurate as of 02/24/17  9:21 PM. Always use your most recent med list.          albuterol 108 (90 Base) MCG/ACT inhaler Commonly known as:  PROAIR HFA Inhale 2 puffs into the lungs every 4 (four) hours as needed for wheezing or shortness of breath.   allopurinol 300 MG tablet Commonly known as:  ZYLOPRIM Take 1 tablet (300 mg total) by mouth daily.   amLODipine 5 MG tablet Commonly known as:  NORVASC Take 1 tablet (5 mg total) by mouth daily.   atorvastatin 40 MG tablet Commonly known as:  LIPITOR Take 1 tablet (40 mg total) by mouth daily.   colchicine 0.6 MG tablet For gout flare: 2 tabs once, 1 tab one hour later, then 1 tab  daily for 5-7 days   fluticasone furoate-vilanterol 200-25 MCG/INH Aepb Commonly known as:  BREO ELLIPTA Inhale 1 puff into the lungs daily.   lisinopril 10 MG tablet Commonly known as:  PRINIVIL,ZESTRIL Take 1 tablet (10 mg total) by mouth daily.        Follow-up: Return in about 6 months (around 08/27/2017).  Claretta Fraise, M.D.

## 2017-02-25 LAB — CMP14+EGFR
ALK PHOS: 84 IU/L (ref 39–117)
ALT: 9 IU/L (ref 0–44)
AST: 16 IU/L (ref 0–40)
Albumin/Globulin Ratio: 2 (ref 1.2–2.2)
Albumin: 4.4 g/dL (ref 3.5–5.5)
BUN/Creatinine Ratio: 8 — ABNORMAL LOW (ref 9–20)
BUN: 10 mg/dL (ref 6–24)
Bilirubin Total: 0.4 mg/dL (ref 0.0–1.2)
CHLORIDE: 100 mmol/L (ref 96–106)
CO2: 26 mmol/L (ref 20–29)
Calcium: 9.2 mg/dL (ref 8.7–10.2)
Creatinine, Ser: 1.18 mg/dL (ref 0.76–1.27)
GFR calc Af Amer: 79 mL/min/{1.73_m2} (ref >59)
GFR calc non Af Amer: 68 mL/min/{1.73_m2} (ref >59)
GLUCOSE: 110 mg/dL — AB (ref 65–99)
Globulin, Total: 2.2 g/dL (ref 1.5–4.5)
Potassium: 4.4 mmol/L (ref 3.5–5.2)
Sodium: 140 mmol/L (ref 134–144)
Total Protein: 6.6 g/dL (ref 6.0–8.5)

## 2017-02-25 LAB — CBC WITH DIFFERENTIAL/PLATELET
BASOS ABS: 0.1 10*3/uL (ref 0.0–0.2)
Basos: 1 %
EOS (ABSOLUTE): 0.1 10*3/uL (ref 0.0–0.4)
Eos: 1 %
Hematocrit: 48.4 % (ref 37.5–51.0)
Hemoglobin: 16.3 g/dL (ref 13.0–17.7)
IMMATURE GRANS (ABS): 0.1 10*3/uL (ref 0.0–0.1)
Immature Granulocytes: 1 %
LYMPHS ABS: 2.2 10*3/uL (ref 0.7–3.1)
LYMPHS: 25 %
MCH: 30.2 pg (ref 26.6–33.0)
MCHC: 33.7 g/dL (ref 31.5–35.7)
MCV: 90 fL (ref 79–97)
Monocytes Absolute: 0.7 10*3/uL (ref 0.1–0.9)
Monocytes: 8 %
NEUTROS ABS: 5.8 10*3/uL (ref 1.4–7.0)
Neutrophils: 64 %
PLATELETS: 176 10*3/uL (ref 150–379)
RBC: 5.4 x10E6/uL (ref 4.14–5.80)
RDW: 13.5 % (ref 12.3–15.4)
WBC: 9 10*3/uL (ref 3.4–10.8)

## 2017-02-25 LAB — LIPID PANEL
CHOLESTEROL TOTAL: 139 mg/dL (ref 100–199)
Chol/HDL Ratio: 3.8 ratio (ref 0.0–5.0)
HDL: 37 mg/dL — AB (ref >39)
LDL Calculated: 72 mg/dL (ref 0–99)
TRIGLYCERIDES: 151 mg/dL — AB (ref 0–149)
VLDL CHOLESTEROL CAL: 30 mg/dL (ref 5–40)

## 2017-02-25 LAB — PSA TOTAL (REFLEX TO FREE): Prostate Specific Ag, Serum: 0.4 ng/mL (ref 0.0–4.0)

## 2017-05-29 IMAGING — DX DG CHEST 2V
3 series · 3 of 3 positions shown · non-contrast
Comparison: PA and lateral chest x-ray May 01, 2015

CLINICAL DATA: Cough and chest congestion for the past several
weeks. Long-term smoker. History of COPD, hypertension, and
hyperlipidemia.

EXAM:
CHEST  2 VIEW

[chest pa]
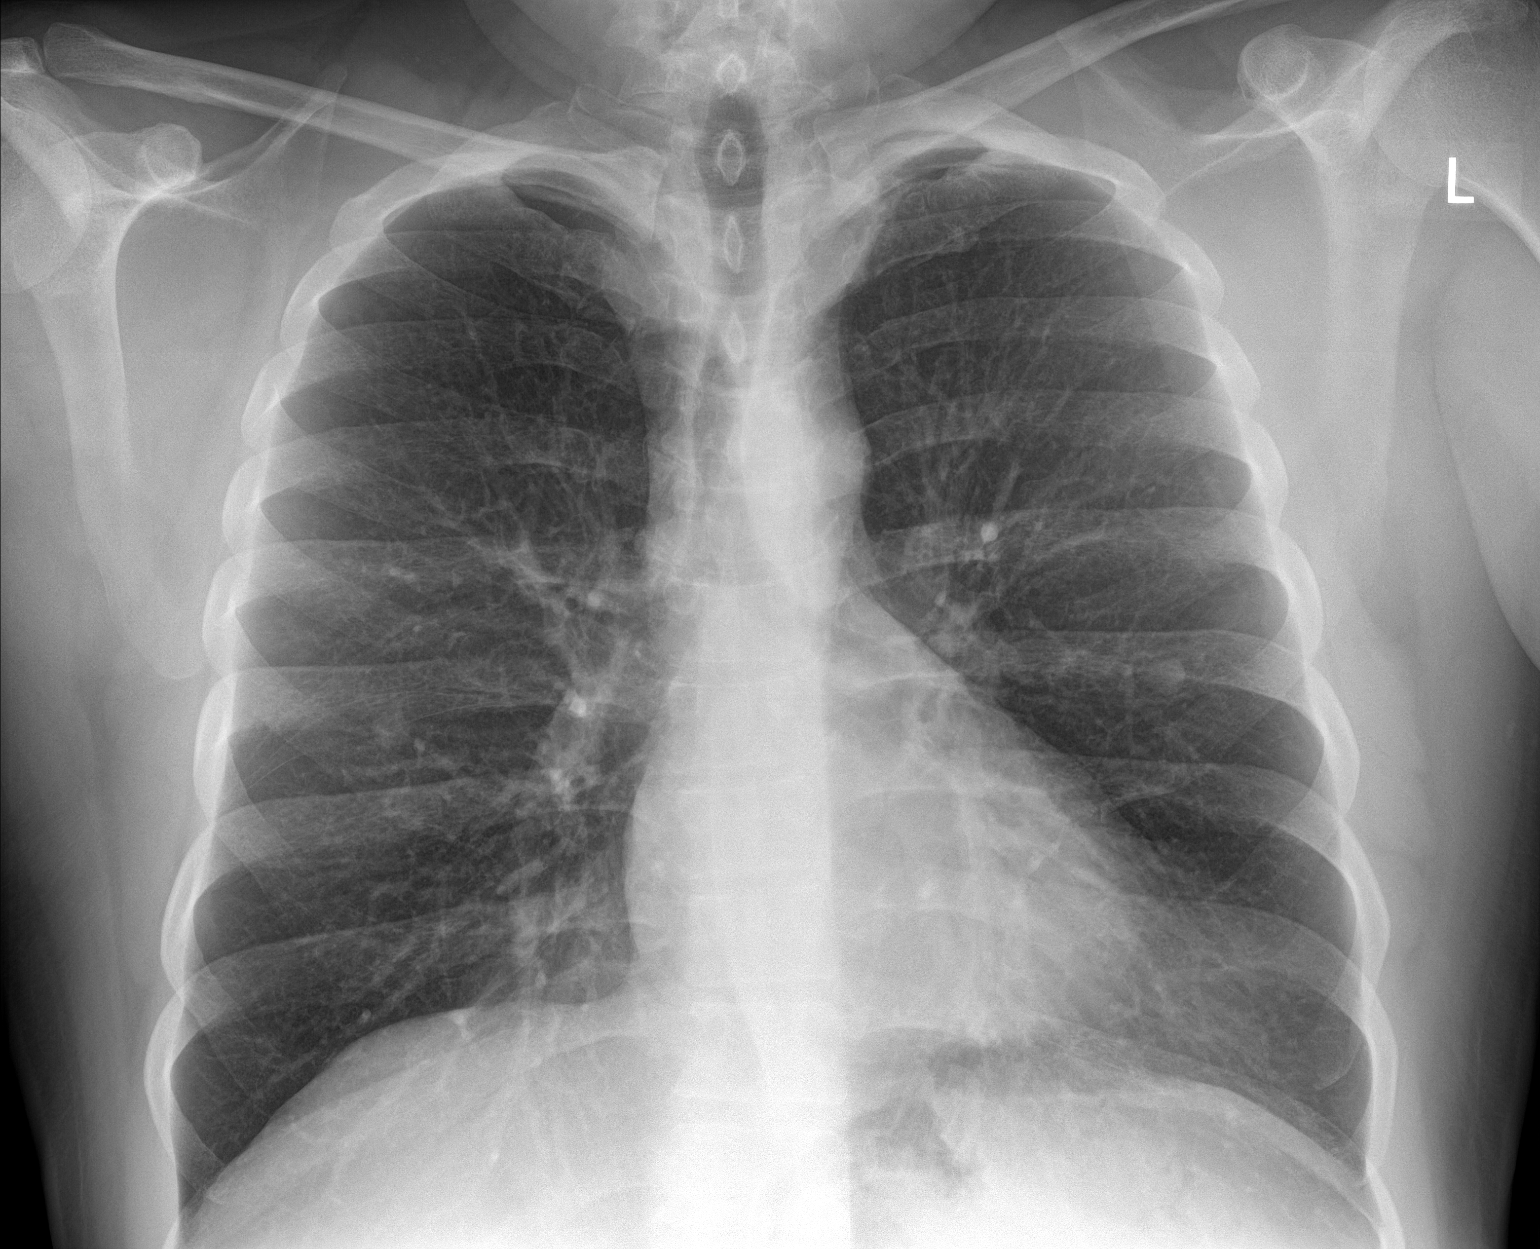

[chest lat (1 of 2)]
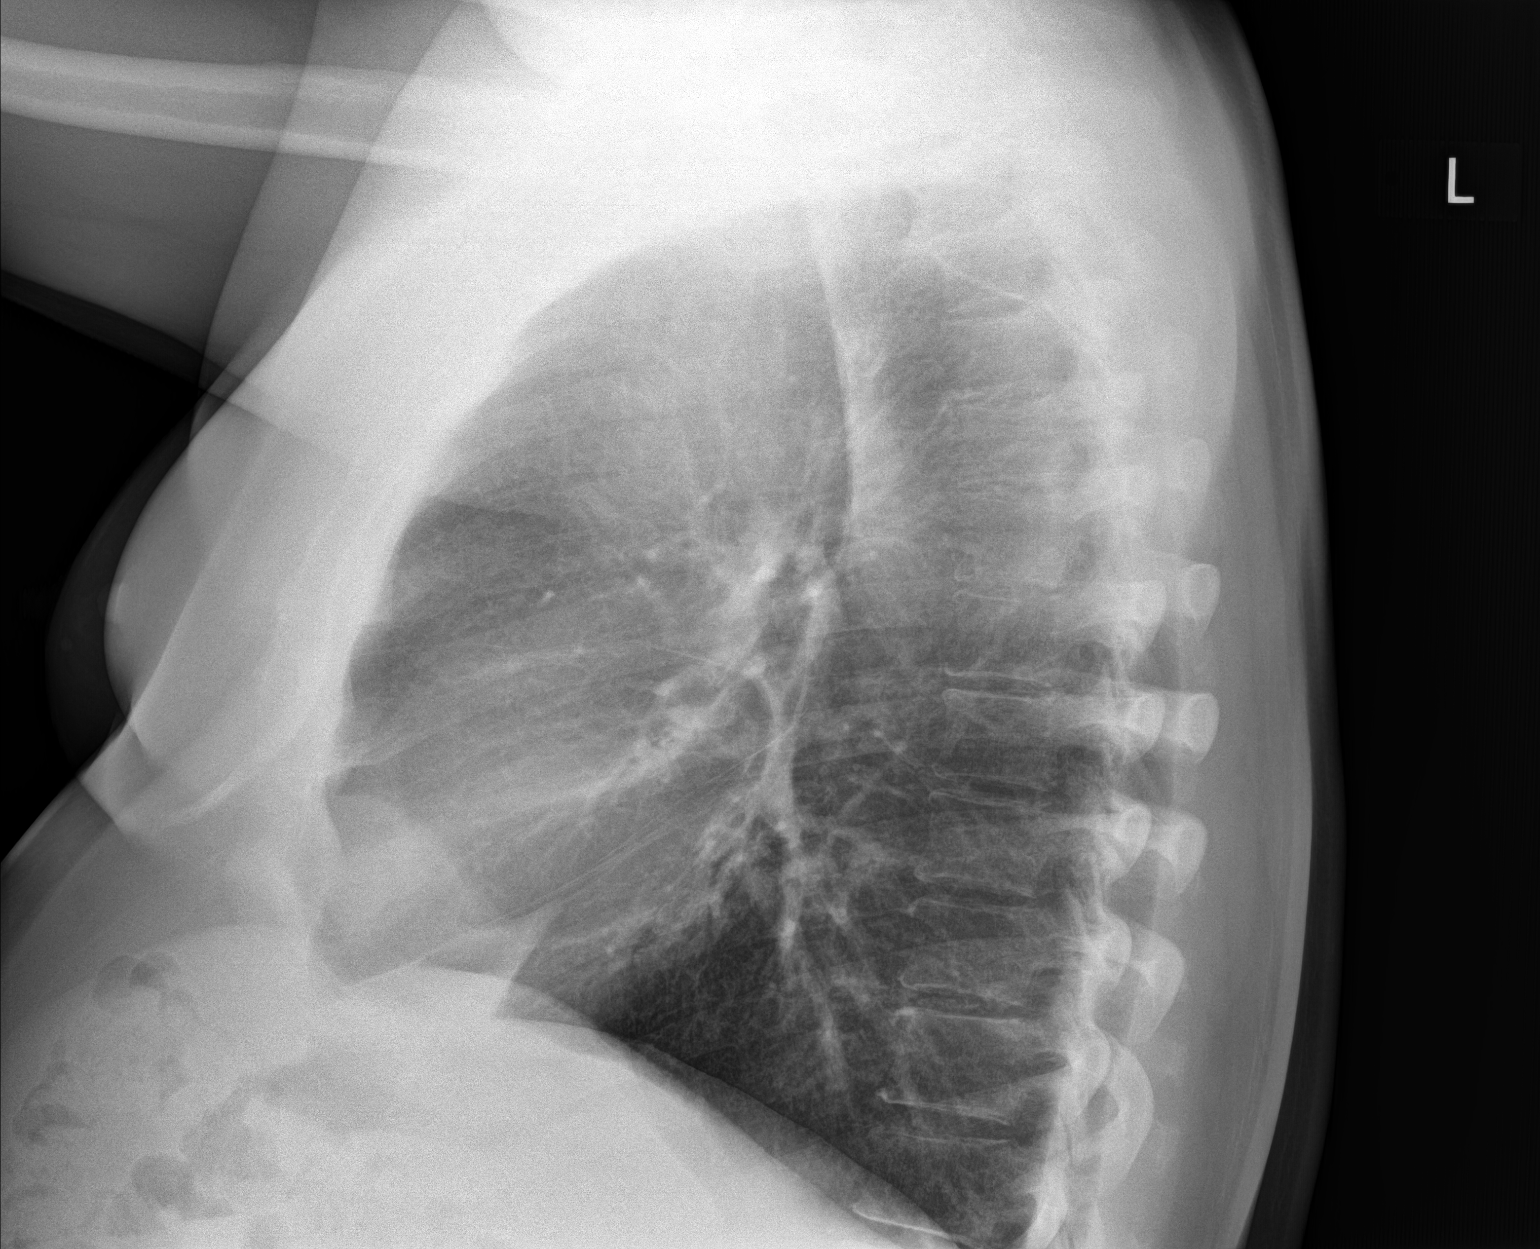

[chest lat (2 of 2)]
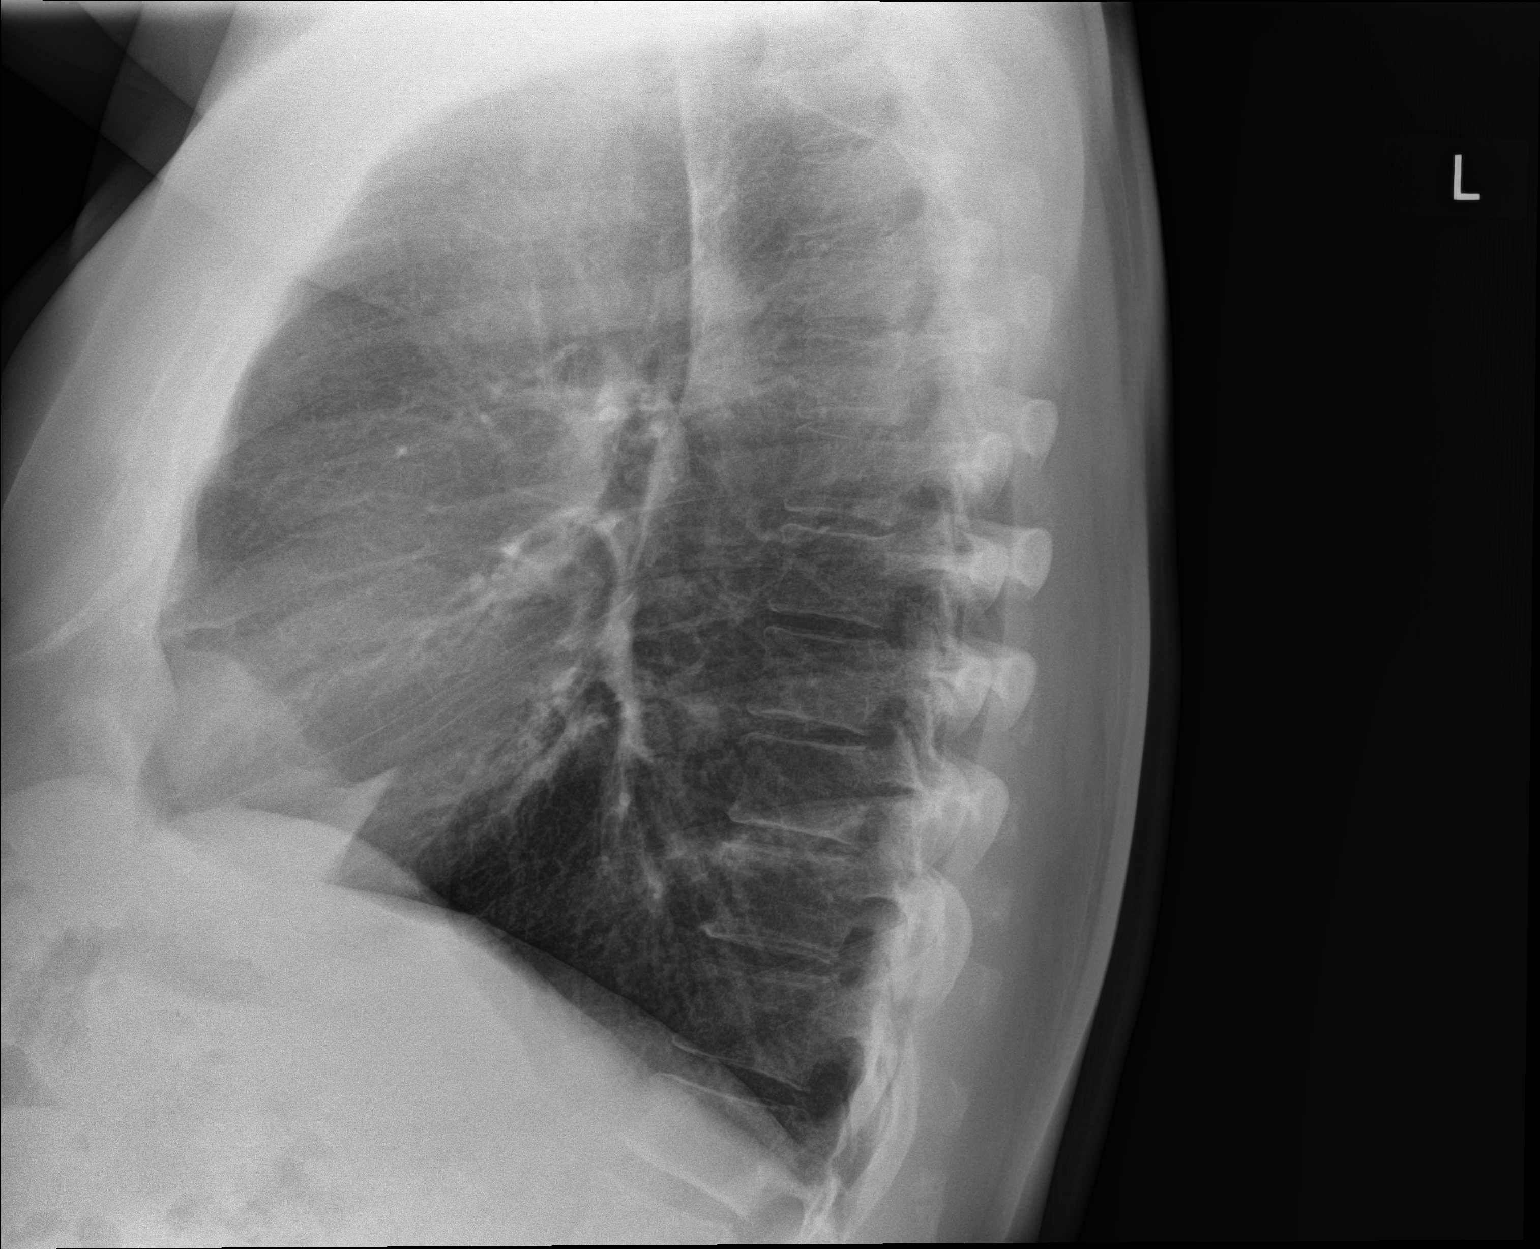

[3 of 3 positions shown; findings below may reference images not displayed]

FINDINGS: The lungs remain hyper inflated. There is no focal infiltrate. There
is no pleural effusion. The heart and pulmonary vascularity are
normal. The mediastinum is normal in width. The trachea is midline.
Nipple shadows are visible bilaterally. The bony thorax exhibits no
acute abnormality.
IMPRESSION: COPD. No pneumonia, CHF, nor other acute cardiopulmonary
abnormality.

## 2017-07-05 ENCOUNTER — Encounter: Payer: Self-pay | Admitting: Family Medicine

## 2017-07-05 ENCOUNTER — Ambulatory Visit (INDEPENDENT_AMBULATORY_CARE_PROVIDER_SITE_OTHER): Payer: 59 | Admitting: Family Medicine

## 2017-07-05 VITALS — BP 139/86 | HR 68 | Temp 97.0°F | Ht 67.0 in | Wt 220.0 lb

## 2017-07-05 DIAGNOSIS — J441 Chronic obstructive pulmonary disease with (acute) exacerbation: Secondary | ICD-10-CM

## 2017-07-05 HISTORY — DX: Chronic obstructive pulmonary disease with (acute) exacerbation: J44.1

## 2017-07-05 MED ORDER — LEVOFLOXACIN 500 MG PO TABS: 500.0000 mg | ORAL_TABLET | Freq: Every day | ORAL | 0 refills | Status: DC

## 2017-07-05 MED ORDER — PREDNISONE 10 MG PO TABS: ORAL_TABLET | ORAL | 0 refills | Status: DC

## 2017-07-05 NOTE — Progress Notes (Signed)
Chief Complaint  Patient presents with  . Cough    pt here today c/o cough and congestion, runny nose     HPI  Patient presents today for Patient presents with upper respiratory congestion. Rhinorrhea that is frequently purulent. There is moderate sore throat. Patient reports coughing frequently as well. Yellow sputum noted. There is no fever, chills or sweats. The patient reports being short of breath. Tried to smoke this morning. Took two tokes and had to put it down.Onset was 3-5 days ago. Gradually worsening. Tried OTCs without improvement.  PMH: Smoking status noted ROS: Per HPI  Objective: BP 139/86   Pulse 68   Temp (!) 97 F (36.1 C) (Oral)   Ht 5\' 7"  (1.702 m)   Wt 220 lb (99.8 kg)   SpO2 94%   BMI 34.46 kg/m  Gen: NAD, alert, cooperative with exam HEENT: NCAT, Nasal passages swollen, red TMS RED CV: RRR, good S1/S2, no murmur Resp: wheezing all fields. Few rales LLL  Ext: No edema, warm Neuro: Alert and oriented, No gross deficits  Assessment and plan:  1. Acute exacerbation of chronic obstructive pulmonary disease (COPD) (HCC)     Meds ordered this encounter  Medications  . levofloxacin (LEVAQUIN) 500 MG tablet    Sig: Take 1 tablet (500 mg total) by mouth daily. For 10 days    Dispense:  10 tablet    Refill:  0  . predniSONE (DELTASONE) 10 MG tablet    Sig: Take 5 daily for 3 days followed by 4,3,2 and 1 for 3 days each.    Dispense:  45 tablet    Refill:  0    No orders of the defined types were placed in this encounter.   Follow up as needed.  Mechele ClaudeWarren Tiziana Cislo, MD

## 2017-07-06 ENCOUNTER — Ambulatory Visit: Payer: 59 | Admitting: Family Medicine

## 2017-08-27 ENCOUNTER — Ambulatory Visit (INDEPENDENT_AMBULATORY_CARE_PROVIDER_SITE_OTHER): Payer: 59 | Admitting: Family Medicine

## 2017-08-27 ENCOUNTER — Encounter: Payer: Self-pay | Admitting: Family Medicine

## 2017-08-27 VITALS — BP 129/77 | HR 67 | Temp 98.2°F | Ht 67.0 in | Wt 219.0 lb

## 2017-08-27 DIAGNOSIS — R7303 Prediabetes: Secondary | ICD-10-CM | POA: Diagnosis not present

## 2017-08-27 DIAGNOSIS — I10 Essential (primary) hypertension: Secondary | ICD-10-CM

## 2017-08-27 DIAGNOSIS — M109 Gout, unspecified: Secondary | ICD-10-CM

## 2017-08-27 DIAGNOSIS — E782 Mixed hyperlipidemia: Secondary | ICD-10-CM | POA: Diagnosis not present

## 2017-08-27 LAB — BAYER DCA HB A1C WAIVED: HB A1C (BAYER DCA - WAIVED): 5.8 % (ref <7.0)

## 2017-08-27 MED ORDER — ALLOPURINOL 300 MG PO TABS: 300.0000 mg | ORAL_TABLET | Freq: Every day | ORAL | 1 refills | Status: DC

## 2017-08-27 MED ORDER — LISINOPRIL 10 MG PO TABS: 10.0000 mg | ORAL_TABLET | Freq: Every day | ORAL | 1 refills | Status: DC

## 2017-08-27 MED ORDER — AMLODIPINE BESYLATE 5 MG PO TABS: 5.0000 mg | ORAL_TABLET | Freq: Every day | ORAL | 1 refills | Status: DC

## 2017-08-27 MED ORDER — PREDNISONE 10 MG PO TABS: ORAL_TABLET | ORAL | 0 refills | Status: DC

## 2017-08-27 MED ORDER — COLCHICINE 0.6 MG PO TABS: ORAL_TABLET | ORAL | 2 refills | Status: DC

## 2017-08-27 MED ORDER — ATORVASTATIN CALCIUM 40 MG PO TABS: 40.0000 mg | ORAL_TABLET | Freq: Every day | ORAL | 1 refills | Status: DC

## 2017-08-27 NOTE — Progress Notes (Signed)
Subjective:  Patient ID: Edwin Taylor, male    DOB: 30-May-1961  Age: 57 y.o. MRN: [id number]  CC: Hypertension (pt here today for routine follow up of his chronic medical conditions and also states he might take his routine medications once a week )   HPI Edwin Taylor presents for follow-up of hypertension. Patient has no history of headache chest pain or shortness of breath or recent cough. Patient also denies symptoms of TIA such as numbness weakness lateralizing. Patient checks  blood pressure at home and has not had any elevated readings recently. Patient denies side effects from his medication. States taking it sporadically at best.  He did make sure to take it the last couple of days since he was coming here today. Patient in for follow-up of elevated cholesterol. Doing well without complaints on current medication. Denies side effects of statin including myalgia and arthralgia and nausea. Also in today for liver function testing. Currently no chest pain, shortness of breath or other cardiovascular related symptoms noted.  Again, his use has been sporadic at best by his own admission.  Patient also in for follow-up of his gout he is using allopurinol regularly.  He reports 2 recent mild gout attacks.  Both self-limited with colchicine Depression screen [facility] 2/9 08/27/2017 07/05/2017 02/24/2017  Decreased Interest 0 0 0  Down, Depressed, Hopeless 0 0 0  PHQ - 2 Score 0 0 0    History Edwin Taylor has a past medical history of Gout, Hernia, Hyperlipidemia, Hypertension, Obesity, and Tobacco use.   He has no past surgical history on file.   His family history includes Arthritis in his mother; Birth defects in his sister; Cancer (age of onset: 57) in his father; Dementia in his maternal grandmother; Diabetes in his father, maternal grandfather, maternal grandmother, and paternal grandmother; Heart disease in his maternal grandfather and paternal grandmother.He reports that he has been smoking  cigarettes.  He has been smoking about 2.00 packs per day. he has never used smokeless tobacco. He reports that he drinks about 10.8 oz of alcohol per week. He reports that he does not use drugs.    ROS Review of Systems  Constitutional: Negative for chills, diaphoresis, fever and unexpected weight change.  HENT: Negative for congestion, hearing loss, rhinorrhea and sore throat.   Eyes: Negative for visual disturbance.  Respiratory: Negative for cough and shortness of breath.   Cardiovascular: Negative for chest pain.  Gastrointestinal: Negative for abdominal pain, constipation and diarrhea.  Genitourinary: Negative for dysuria and flank pain.  Musculoskeletal: Positive for arthralgias. Negative for joint swelling.  Skin: Negative for rash.  Neurological: Negative for dizziness and headaches.  Psychiatric/Behavioral: Negative for dysphoric mood and sleep disturbance.    Objective:  BP 129/77   Pulse 67   Temp 98.2 F (36.8 C) (Oral)   Ht '5\' 7"'$  (1.702 m)   Wt 219 lb (99.3 kg)   BMI 34.30 kg/m   BP Readings from Last 3 Encounters:  08/27/17 129/77  07/05/17 139/86  02/24/17 131/72    Wt Readings from Last 3 Encounters:  08/27/17 219 lb (99.3 kg)  07/05/17 220 lb (99.8 kg)  02/24/17 216 lb (98 kg)     Physical Exam  Constitutional: He is oriented to person, place, and time. He appears well-developed and well-nourished. No distress.  HENT:  Head: Normocephalic and atraumatic.  Right Ear: External ear normal.  Left Ear: External ear normal.  Nose: Nose normal.  Mouth/Throat: Oropharynx is clear and moist.  Eyes: Conjunctivae and EOM are normal. Pupils are equal, round, and reactive to light.  Neck: Normal range of motion. Neck supple. No thyromegaly present.  Cardiovascular: Normal rate, regular rhythm and normal heart sounds.  No murmur heard. Pulmonary/Chest: Effort normal and breath sounds normal. No respiratory distress. He has no wheezes. He has no rales.    Abdominal: Soft. Bowel sounds are normal. He exhibits no distension. There is no tenderness.  Lymphadenopathy:    He has no cervical adenopathy.  Neurological: He is alert and oriented to person, place, and time. He has normal reflexes.  Skin: Skin is warm and dry.  Psychiatric: He has a normal mood and affect. His behavior is normal. Judgment and thought content normal.      Assessment & Plan:   Glenda was seen today for hypertension.  Diagnoses and all orders for this visit:  Essential hypertension -     CMP14+EGFR  Gout of multiple sites, unspecified cause, unspecified chronicity -     Uric acid  Prediabetes -     Bayer DCA Hb A1c Waived  Mixed hyperlipidemia -     Lipid panel  Other orders -     allopurinol (ZYLOPRIM) 300 MG tablet; Take 1 tablet (300 mg total) by mouth daily. -     amLODipine (NORVASC) 5 MG tablet; Take 1 tablet (5 mg total) by mouth daily. -     atorvastatin (LIPITOR) 40 MG tablet; Take 1 tablet (40 mg total) by mouth daily. -     colchicine 0.6 MG tablet; For gout flare: 2 tabs once, 1 tab one hour later, then 1 tab daily for 5-7 days -     lisinopril (PRINIVIL,ZESTRIL) 10 MG tablet; Take 1 tablet (10 mg total) by mouth daily. -     predniSONE (DELTASONE) 10 MG tablet; Take 5 daily for 3 days followed by 4,3,2 and 1 for 3 days each.       I have discontinued Edwin Taylor's levofloxacin. I am also having him maintain his albuterol, fluticasone furoate-vilanterol, allopurinol, amLODipine, atorvastatin, colchicine, lisinopril, and predniSONE.  Allergies as of 08/27/2017   No Known Allergies     Medication List        Accurate as of 08/27/17  6:10 PM. Always use your most recent med list.          albuterol 108 (90 Base) MCG/ACT inhaler Commonly known as:  PROAIR HFA Inhale 2 puffs into the lungs every 4 (four) hours as needed for wheezing or shortness of breath.   allopurinol 300 MG tablet Commonly known as:  ZYLOPRIM Take 1 tablet  (300 mg total) by mouth daily.   amLODipine 5 MG tablet Commonly known as:  NORVASC Take 1 tablet (5 mg total) by mouth daily.   atorvastatin 40 MG tablet Commonly known as:  LIPITOR Take 1 tablet (40 mg total) by mouth daily.   colchicine 0.6 MG tablet For gout flare: 2 tabs once, 1 tab one hour later, then 1 tab daily for 5-7 days   fluticasone furoate-vilanterol 200-25 MCG/INH Aepb Commonly known as:  BREO ELLIPTA Inhale 1 puff into the lungs daily.   lisinopril 10 MG tablet Commonly known as:  PRINIVIL,ZESTRIL Take 1 tablet (10 mg total) by mouth daily.   predniSONE 10 MG tablet Commonly known as:  DELTASONE Take 5 daily for 3 days followed by 4,3,2 and 1 for 3 days each.        Follow-up: Return in about 6 months (around 02/24/2018).  Claretta Fraise, M.D.

## 2017-08-28 LAB — LIPID PANEL
CHOL/HDL RATIO: 4 ratio (ref 0.0–5.0)
Cholesterol, Total: 166 mg/dL (ref 100–199)
HDL: 41 mg/dL (ref >39)
LDL CALC: 96 mg/dL (ref 0–99)
Triglycerides: 145 mg/dL (ref 0–149)
VLDL Cholesterol Cal: 29 mg/dL (ref 5–40)

## 2017-08-28 LAB — CMP14+EGFR
ALT: 14 IU/L (ref 0–44)
AST: 14 IU/L (ref 0–40)
Albumin/Globulin Ratio: 1.8 (ref 1.2–2.2)
Albumin: 4.4 g/dL (ref 3.5–5.5)
Alkaline Phosphatase: 73 IU/L (ref 39–117)
BUN/Creatinine Ratio: 8 — ABNORMAL LOW (ref 9–20)
BUN: 11 mg/dL (ref 6–24)
Bilirubin Total: 0.4 mg/dL (ref 0.0–1.2)
CALCIUM: 8.9 mg/dL (ref 8.7–10.2)
CO2: 26 mmol/L (ref 20–29)
CREATININE: 1.36 mg/dL — AB (ref 0.76–1.27)
Chloride: 101 mmol/L (ref 96–106)
GFR, EST AFRICAN AMERICAN: 66 mL/min/{1.73_m2} (ref >59)
GFR, EST NON AFRICAN AMERICAN: 57 mL/min/{1.73_m2} — AB (ref >59)
GLOBULIN, TOTAL: 2.4 g/dL (ref 1.5–4.5)
Glucose: 106 mg/dL — ABNORMAL HIGH (ref 65–99)
POTASSIUM: 4.5 mmol/L (ref 3.5–5.2)
Sodium: 142 mmol/L (ref 134–144)
TOTAL PROTEIN: 6.8 g/dL (ref 6.0–8.5)

## 2017-08-28 LAB — URIC ACID: Uric Acid: 8.5 mg/dL (ref 3.7–8.6)

## 2018-02-25 ENCOUNTER — Ambulatory Visit (INDEPENDENT_AMBULATORY_CARE_PROVIDER_SITE_OTHER): Payer: 59 | Admitting: Family Medicine

## 2018-02-25 ENCOUNTER — Encounter: Payer: Self-pay | Admitting: Family Medicine

## 2018-02-25 VITALS — BP 133/73 | HR 62 | Temp 98.8°F | Ht 67.0 in | Wt 223.0 lb

## 2018-02-25 DIAGNOSIS — M109 Gout, unspecified: Secondary | ICD-10-CM | POA: Diagnosis not present

## 2018-02-25 DIAGNOSIS — E782 Mixed hyperlipidemia: Secondary | ICD-10-CM | POA: Diagnosis not present

## 2018-02-25 DIAGNOSIS — I10 Essential (primary) hypertension: Secondary | ICD-10-CM

## 2018-02-25 DIAGNOSIS — J449 Chronic obstructive pulmonary disease, unspecified: Secondary | ICD-10-CM | POA: Diagnosis not present

## 2018-02-25 DIAGNOSIS — R7303 Prediabetes: Secondary | ICD-10-CM

## 2018-02-25 LAB — BAYER DCA HB A1C WAIVED: HB A1C: 6.1 % (ref <7.0)

## 2018-02-25 MED ORDER — AMLODIPINE BESYLATE 5 MG PO TABS: 5.0000 mg | ORAL_TABLET | Freq: Every day | ORAL | 1 refills | Status: DC

## 2018-02-25 MED ORDER — FLUTICASONE FUROATE-VILANTEROL 200-25 MCG/INH IN AEPB: 1.0000 | INHALATION_SPRAY | Freq: Every day | RESPIRATORY_TRACT | 11 refills | Status: DC

## 2018-02-25 MED ORDER — COLCHICINE 0.6 MG PO TABS: ORAL_TABLET | ORAL | 2 refills | Status: DC

## 2018-02-25 MED ORDER — ATORVASTATIN CALCIUM 40 MG PO TABS: 40.0000 mg | ORAL_TABLET | Freq: Every day | ORAL | 1 refills | Status: DC

## 2018-02-25 MED ORDER — LISINOPRIL 10 MG PO TABS: 10.0000 mg | ORAL_TABLET | Freq: Every day | ORAL | 1 refills | Status: DC

## 2018-02-25 MED ORDER — ALLOPURINOL 300 MG PO TABS: 300.0000 mg | ORAL_TABLET | Freq: Every day | ORAL | 1 refills | Status: DC

## 2018-02-25 MED ORDER — ALBUTEROL SULFATE HFA 108 (90 BASE) MCG/ACT IN AERS: 2.0000 | INHALATION_SPRAY | RESPIRATORY_TRACT | 2 refills | Status: DC | PRN

## 2018-02-25 NOTE — Progress Notes (Signed)
Subjective:  Patient ID: Edwin Taylor,  male    DOB: 03-29-1961  Age: 57 y.o.    CC: Medical Management of Chronic Issues   HPI Edwin Taylor presents for  follow-up of hypertension. Patient has no history of headache chest pain or shortness of breath or recent cough. Patient also denies symptoms of TIA such as numbness weakness lateralizing.  His only concern today is for fatigue.  Patient denies side effects from medication.  He does not take a beta-blocker.  States taking it regularly.  Patient also  in for follow-up of elevated cholesterol.  Today he tells me that he has decreased the atorvastatin to every other day due to some back pain.  It seems to have improved with the change.  Also in today for liver function testing. Currently no chest pain, shortness of breath or other cardiovascular related symptoms noted.  Patient denies joint pain from his gout recently he continues to take allopurinol regularly.  His COPD has been stable.  He continues to use his Breo daily without dyspnea.  History Edwin Taylor has a past medical history of Gout, Hernia, Hyperlipidemia, Hypertension, Obesity, and Tobacco use.   He has no past surgical history on file.   His family history includes Arthritis in his mother; Birth defects in his sister; Cancer (age of onset: 2) in his father; Dementia in his maternal grandmother; Diabetes in his father, maternal grandfather, maternal grandmother, and paternal grandmother; Heart disease in his maternal grandfather and paternal grandmother.He reports that he has been smoking cigarettes.  He has been smoking about 2.00 packs per day. He has never used smokeless tobacco. He reports that he drinks about 10.8 oz of alcohol per week. He reports that he does not use drugs.  No current outpatient medications on file prior to visit.   No current facility-administered medications on file prior to visit.     ROS Review of Systems  Constitutional: Positive for  fatigue.  HENT: Negative.   Eyes: Negative for visual disturbance.  Respiratory: Negative for cough and shortness of breath.   Cardiovascular: Negative for chest pain and leg swelling.  Gastrointestinal: Negative for abdominal pain, diarrhea, nausea and vomiting.  Genitourinary: Negative for difficulty urinating.  Musculoskeletal: Negative for arthralgias and myalgias.  Skin: Negative for rash.  Neurological: Negative for headaches.  Psychiatric/Behavioral: Negative for sleep disturbance.    Objective:  BP 133/73   Pulse 62   Temp 98.8 F (37.1 C) (Oral)   Ht 5' 7" (1.702 m)   Wt 223 lb (101.2 kg)   BMI 34.93 kg/m   BP Readings from Last 3 Encounters:  02/25/18 133/73  08/27/17 129/77  07/05/17 139/86    Wt Readings from Last 3 Encounters:  02/25/18 223 lb (101.2 kg)  08/27/17 219 lb (99.3 kg)  07/05/17 220 lb (99.8 kg)     Physical Exam  Constitutional: He is oriented to person, place, and time. He appears well-developed and well-nourished. No distress.  HENT:  Head: Normocephalic and atraumatic.  Right Ear: External ear normal.  Left Ear: External ear normal.  Nose: Nose normal.  Mouth/Throat: Oropharynx is clear and moist.  Eyes: Pupils are equal, round, and reactive to light. Conjunctivae and EOM are normal.  Neck: Normal range of motion. Neck supple.  Cardiovascular: Normal rate, regular rhythm and normal heart sounds.  No murmur heard. Pulmonary/Chest: Effort normal and breath sounds normal. No respiratory distress. He has no wheezes. He has no rales.  Abdominal: Soft. There is  no tenderness.  Musculoskeletal: Normal range of motion.  Neurological: He is alert and oriented to person, place, and time. He has normal reflexes.  Skin: Skin is warm and dry.  Psychiatric: He has a normal mood and affect. His behavior is normal. Judgment and thought content normal.    Diabetic Foot Exam - Simple   No data filed        Assessment & Plan:   Edwin Taylor was seen  today for medical management of chronic issues.  Diagnoses and all orders for this visit:  Essential hypertension -     CBC with Differential/Platelet -     CMP14+EGFR -     Lipid panel -     Thyroid Panel With TSH -     Testosterone,Free and Total; Future -     VITAMIN D 25 Hydroxy (Vit-D Deficiency, Fractures)  COPD mixed type (HCC) -     CBC with Differential/Platelet -     CMP14+EGFR -     Lipid panel -     Thyroid Panel With TSH -     Testosterone,Free and Total; Future -     VITAMIN D 25 Hydroxy (Vit-D Deficiency, Fractures)  Mixed hyperlipidemia -     CBC with Differential/Platelet -     CMP14+EGFR -     Lipid panel -     Thyroid Panel With TSH -     Testosterone,Free and Total; Future -     VITAMIN D 25 Hydroxy (Vit-D Deficiency, Fractures)  Pre-diabetes -     CBC with Differential/Platelet -     CMP14+EGFR -     Lipid panel -     Thyroid Panel With TSH -     Testosterone,Free and Total; Future -     VITAMIN D 25 Hydroxy (Vit-D Deficiency, Fractures) -     Bayer DCA Hb A1c Waived  Gout of multiple sites, unspecified cause, unspecified chronicity -     Uric acid -     Testosterone,Free and Total; Future -     VITAMIN D 25 Hydroxy (Vit-D Deficiency, Fractures)  Other orders -     albuterol (PROAIR HFA) 108 (90 Base) MCG/ACT inhaler; Inhale 2 puffs into the lungs every 4 (four) hours as needed for wheezing or shortness of breath. -     atorvastatin (LIPITOR) 40 MG tablet; Take 1 tablet (40 mg total) by mouth daily. -     lisinopril (PRINIVIL,ZESTRIL) 10 MG tablet; Take 1 tablet (10 mg total) by mouth daily. -     fluticasone furoate-vilanterol (BREO ELLIPTA) 200-25 MCG/INH AEPB; Inhale 1 puff into the lungs daily. -     colchicine 0.6 MG tablet; For gout flare: 2 tabs once, 1 tab one hour later, then 1 tab daily for 5-7 days -     amLODipine (NORVASC) 5 MG tablet; Take 1 tablet (5 mg total) by mouth daily. -     allopurinol (ZYLOPRIM) 300 MG tablet; Take 1 tablet  (300 mg total) by mouth daily.   I have discontinued Edwin Taylor's predniSONE. I am also having him maintain his albuterol, atorvastatin, lisinopril, fluticasone furoate-vilanterol, colchicine, amLODipine, and allopurinol.  Meds ordered this encounter  Medications  . albuterol (PROAIR HFA) 108 (90 Base) MCG/ACT inhaler    Sig: Inhale 2 puffs into the lungs every 4 (four) hours as needed for wheezing or shortness of breath.    Dispense:  1 Inhaler    Refill:  2  . atorvastatin (LIPITOR) 40 MG tablet  Sig: Take 1 tablet (40 mg total) by mouth daily.    Dispense:  90 tablet    Refill:  1  . lisinopril (PRINIVIL,ZESTRIL) 10 MG tablet    Sig: Take 1 tablet (10 mg total) by mouth daily.    Dispense:  90 tablet    Refill:  1  . fluticasone furoate-vilanterol (BREO ELLIPTA) 200-25 MCG/INH AEPB    Sig: Inhale 1 puff into the lungs daily.    Dispense:  1 each    Refill:  11  . colchicine 0.6 MG tablet    Sig: For gout flare: 2 tabs once, 1 tab one hour later, then 1 tab daily for 5-7 days    Dispense:  10 tablet    Refill:  2  . amLODipine (NORVASC) 5 MG tablet    Sig: Take 1 tablet (5 mg total) by mouth daily.    Dispense:  90 tablet    Refill:  1  . allopurinol (ZYLOPRIM) 300 MG tablet    Sig: Take 1 tablet (300 mg total) by mouth daily.    Dispense:  90 tablet    Refill:  1     Follow-up: No follow-ups on file.  Claretta Fraise, M.D.

## 2018-02-26 LAB — CBC WITH DIFFERENTIAL/PLATELET
BASOS ABS: 0.1 10*3/uL (ref 0.0–0.2)
Basos: 1 %
EOS (ABSOLUTE): 0.2 10*3/uL (ref 0.0–0.4)
Eos: 1 %
Hematocrit: 47.9 % (ref 37.5–51.0)
Hemoglobin: 15.9 g/dL (ref 13.0–17.7)
IMMATURE GRANS (ABS): 0.4 10*3/uL — AB (ref 0.0–0.1)
Immature Granulocytes: 3 %
LYMPHS ABS: 3.1 10*3/uL (ref 0.7–3.1)
LYMPHS: 24 %
MCH: 30.1 pg (ref 26.6–33.0)
MCHC: 33.2 g/dL (ref 31.5–35.7)
MCV: 91 fL (ref 79–97)
Monocytes Absolute: 1.1 10*3/uL — ABNORMAL HIGH (ref 0.1–0.9)
Monocytes: 8 %
NEUTROS ABS: 8 10*3/uL — AB (ref 1.4–7.0)
Neutrophils: 63 %
PLATELETS: 177 10*3/uL (ref 150–450)
RBC: 5.28 x10E6/uL (ref 4.14–5.80)
RDW: 12.9 % (ref 12.3–15.4)
WBC: 12.9 10*3/uL — ABNORMAL HIGH (ref 3.4–10.8)

## 2018-02-26 LAB — URIC ACID: URIC ACID: 7 mg/dL (ref 3.7–8.6)

## 2018-02-26 LAB — LIPID PANEL
Chol/HDL Ratio: 3.7 ratio (ref 0.0–5.0)
Cholesterol, Total: 143 mg/dL (ref 100–199)
HDL: 39 mg/dL — ABNORMAL LOW (ref >39)
LDL Calculated: 71 mg/dL (ref 0–99)
Triglycerides: 167 mg/dL — ABNORMAL HIGH (ref 0–149)
VLDL CHOLESTEROL CAL: 33 mg/dL (ref 5–40)

## 2018-02-26 LAB — THYROID PANEL WITH TSH
Free Thyroxine Index: 1.8 (ref 1.2–4.9)
T3 Uptake Ratio: 27 % (ref 24–39)
T4 TOTAL: 6.8 ug/dL (ref 4.5–12.0)
TSH: 3.98 u[IU]/mL (ref 0.450–4.500)

## 2018-02-26 LAB — CMP14+EGFR
ALK PHOS: 86 IU/L (ref 39–117)
ALT: 11 IU/L (ref 0–44)
AST: 16 IU/L (ref 0–40)
Albumin/Globulin Ratio: 1.9 (ref 1.2–2.2)
Albumin: 4.5 g/dL (ref 3.5–5.5)
BILIRUBIN TOTAL: 0.3 mg/dL (ref 0.0–1.2)
BUN/Creatinine Ratio: 9 (ref 9–20)
BUN: 11 mg/dL (ref 6–24)
CHLORIDE: 99 mmol/L (ref 96–106)
CO2: 25 mmol/L (ref 20–29)
Calcium: 9.2 mg/dL (ref 8.7–10.2)
Creatinine, Ser: 1.26 mg/dL (ref 0.76–1.27)
GFR calc Af Amer: 72 mL/min/{1.73_m2} (ref >59)
GFR calc non Af Amer: 62 mL/min/{1.73_m2} (ref >59)
GLUCOSE: 92 mg/dL (ref 65–99)
Globulin, Total: 2.4 g/dL (ref 1.5–4.5)
Potassium: 4.4 mmol/L (ref 3.5–5.2)
Sodium: 141 mmol/L (ref 134–144)
Total Protein: 6.9 g/dL (ref 6.0–8.5)

## 2018-02-26 LAB — VITAMIN D 25 HYDROXY (VIT D DEFICIENCY, FRACTURES): VIT D 25 HYDROXY: 20.6 ng/mL — AB (ref 30.0–100.0)

## 2018-02-28 ENCOUNTER — Other Ambulatory Visit: Payer: Self-pay | Admitting: *Deleted

## 2018-02-28 MED ORDER — VITAMIN D (ERGOCALCIFEROL) 1.25 MG (50000 UNIT) PO CAPS: 50000.0000 [IU] | ORAL_CAPSULE | ORAL | 1 refills | Status: DC

## 2018-03-08 ENCOUNTER — Encounter: Payer: Self-pay | Admitting: Family Medicine

## 2018-06-02 ENCOUNTER — Telehealth: Payer: Self-pay | Admitting: Family Medicine

## 2018-06-02 NOTE — Telephone Encounter (Signed)
Detailed message left that patient will need an appointment to be seen.

## 2018-08-30 ENCOUNTER — Ambulatory Visit: Payer: 59 | Admitting: Family Medicine

## 2018-08-30 ENCOUNTER — Ambulatory Visit (INDEPENDENT_AMBULATORY_CARE_PROVIDER_SITE_OTHER): Payer: 59

## 2018-08-30 ENCOUNTER — Encounter: Payer: Self-pay | Admitting: Family Medicine

## 2018-08-30 ENCOUNTER — Ambulatory Visit (INDEPENDENT_AMBULATORY_CARE_PROVIDER_SITE_OTHER): Payer: 59 | Admitting: Family Medicine

## 2018-08-30 VITALS — BP 133/85 | HR 73 | Temp 97.5°F | Ht 67.0 in | Wt 223.5 lb

## 2018-08-30 DIAGNOSIS — E782 Mixed hyperlipidemia: Secondary | ICD-10-CM | POA: Diagnosis not present

## 2018-08-30 DIAGNOSIS — I10 Essential (primary) hypertension: Secondary | ICD-10-CM | POA: Diagnosis not present

## 2018-08-30 DIAGNOSIS — R7303 Prediabetes: Secondary | ICD-10-CM

## 2018-08-30 DIAGNOSIS — M25512 Pain in left shoulder: Secondary | ICD-10-CM

## 2018-08-30 DIAGNOSIS — M79622 Pain in left upper arm: Secondary | ICD-10-CM | POA: Diagnosis not present

## 2018-08-30 DIAGNOSIS — E559 Vitamin D deficiency, unspecified: Secondary | ICD-10-CM | POA: Diagnosis not present

## 2018-08-30 DIAGNOSIS — J449 Chronic obstructive pulmonary disease, unspecified: Secondary | ICD-10-CM

## 2018-08-30 DIAGNOSIS — M47812 Spondylosis without myelopathy or radiculopathy, cervical region: Secondary | ICD-10-CM | POA: Diagnosis not present

## 2018-08-30 LAB — BAYER DCA HB A1C WAIVED: HB A1C (BAYER DCA - WAIVED): 6.1 % (ref <7.0)

## 2018-08-30 MED ORDER — DICLOFENAC SODIUM 75 MG PO TBEC: 75.0000 mg | DELAYED_RELEASE_TABLET | Freq: Two times a day (BID) | ORAL | 2 refills | Status: DC

## 2018-08-30 MED ORDER — AMLODIPINE BESYLATE 5 MG PO TABS: 5.0000 mg | ORAL_TABLET | Freq: Every day | ORAL | 1 refills | Status: DC

## 2018-08-30 MED ORDER — ATORVASTATIN CALCIUM 40 MG PO TABS: 40.0000 mg | ORAL_TABLET | Freq: Every day | ORAL | 1 refills | Status: DC

## 2018-08-30 MED ORDER — ALBUTEROL SULFATE HFA 108 (90 BASE) MCG/ACT IN AERS: 2.0000 | INHALATION_SPRAY | RESPIRATORY_TRACT | 2 refills | Status: DC | PRN

## 2018-08-30 MED ORDER — VITAMIN D (ERGOCALCIFEROL) 1.25 MG (50000 UNIT) PO CAPS: 50000.0000 [IU] | ORAL_CAPSULE | ORAL | 1 refills | Status: DC

## 2018-08-30 MED ORDER — ALLOPURINOL 300 MG PO TABS: 300.0000 mg | ORAL_TABLET | Freq: Every day | ORAL | 1 refills | Status: DC

## 2018-08-30 MED ORDER — LISINOPRIL 10 MG PO TABS: 10.0000 mg | ORAL_TABLET | Freq: Every day | ORAL | 1 refills | Status: DC

## 2018-08-30 NOTE — Patient Instructions (Signed)

## 2018-08-30 NOTE — Progress Notes (Signed)
Subjective:  Patient ID: Edwin Taylor, male    DOB: 02/13/1961  Age: 58 y.o. MRN: [id number]  CC: Medical Management of Chronic Issues (pt here today for routine follow up of chronic problems and also c/o left shoulder pain on and of for the past few years)   HPI Edwin Taylor presents for  follow-up of hypertension. Patient has no history of headache chest pain or shortness of breath or recent cough. Patient also denies symptoms of TIA such as focal numbness or weakness. Patient denies side effects from medication. States taking it regularly. Patient also complaining of off-and-on pain for years in his left shoulder.  It was diagnosed several years ago as a pinched nerve.  He went through various treatments including what he describes as electrical shock.  Nothing helped.  It occurs when he flexes his shoulder forward without abduction.  It will start anteriorly near the midclavicular area.  And radiate until his entire shoulder is involved in a throbbing sensation rated 7/10.  This flexion is generally when he is holding the steering wheel to drive which he does several hours a day.  He will rest the arm and then about 45 minutes the arm will feel better. Patient in for follow-up of elevated cholesterol. Doing well without complaints on current medication. Denies side effects of statin including myalgia and arthralgia and nausea. Also in today for liver function testing. Currently no chest pain, shortness of breath or other cardiovascular related symptoms noted.  Patient additionally tells me that he does not eat breakfast.  He eats [employer] double cheeseburgers for lunch but he quits eating the fries when the hamburgers are gone and does not eat them all.  He will get chicken nuggets but take some of them home to his dog.  At supper he eats what ever his wife has prepared.  He says he eats for taste.  He is not been just started eating salads and things.  He tells me that the food is turned  into fuel when he eats it and he does not need to be concerned about what he eats.  He does admit that he probably should exercise more. History Edwin Taylor has a past medical history of Gout, Hernia, Hyperlipidemia, Hypertension, Obesity, and Tobacco use.   He has no past surgical history on file.   His family history includes Arthritis in his mother; Birth defects in his sister; Cancer (age of onset: 8) in his father; Dementia in his maternal grandmother; Diabetes in his father, maternal grandfather, maternal grandmother, and paternal grandmother; Heart disease in his maternal grandfather and paternal grandmother.He reports that he has been smoking cigarettes. He has been smoking about 2.00 packs per day. He has never used smokeless tobacco. He reports current alcohol use of about 18.0 standard drinks of alcohol per week. He reports that he does not use drugs.  Current Outpatient Medications on File Prior to Visit  Medication Sig Dispense Refill  . colchicine 0.6 MG tablet For gout flare: 2 tabs once, 1 tab one hour later, then 1 tab daily for 5-7 days 10 tablet 2  . fluticasone furoate-vilanterol (BREO ELLIPTA) 200-25 MCG/INH AEPB Inhale 1 puff into the lungs daily. 1 each 11   No current facility-administered medications on file prior to visit.     ROS Review of Systems  Constitutional: Negative.   HENT: Negative.   Eyes: Negative for visual disturbance.  Respiratory: Negative for cough and shortness of breath.   Cardiovascular: Negative for chest  pain and leg swelling.  Gastrointestinal: Negative for abdominal pain, diarrhea, nausea and vomiting.  Genitourinary: Negative for difficulty urinating.  Musculoskeletal: Negative for arthralgias (Left shoulder) and myalgias.  Skin: Negative for rash.  Neurological: Negative for headaches.  Psychiatric/Behavioral: Negative for sleep disturbance.    Objective:  BP 133/85   Pulse 73   Temp (!) 97.5 F (36.4 C) (Oral)   Ht '5\' 7"'  (1.702 m)    Wt 223 lb 8 oz (101.4 kg)   BMI 35.01 kg/m   BP Readings from Last 3 Encounters:  08/30/18 133/85  02/25/18 133/73  08/27/17 129/77    Wt Readings from Last 3 Encounters:  08/30/18 223 lb 8 oz (101.4 kg)  02/25/18 223 lb (101.2 kg)  08/27/17 219 lb (99.3 kg)     Physical Exam Constitutional:      General: He is not in acute distress.    Appearance: He is well-developed.  HENT:     Head: Normocephalic and atraumatic.     Right Ear: External ear normal.     Left Ear: External ear normal.     Nose: Nose normal.  Eyes:     Conjunctiva/sclera: Conjunctivae normal.     Pupils: Pupils are equal, round, and reactive to light.  Neck:     Musculoskeletal: Normal range of motion and neck supple.  Cardiovascular:     Rate and Rhythm: Normal rate and regular rhythm.     Heart sounds: Normal heart sounds. No murmur.  Pulmonary:     Effort: Pulmonary effort is normal. No respiratory distress.     Breath sounds: Normal breath sounds. No wheezing or rales.  Abdominal:     General: There is distension (Rotund).     Palpations: Abdomen is soft.     Tenderness: There is no abdominal tenderness.  Musculoskeletal: Normal range of motion.  Skin:    General: Skin is warm and dry.  Neurological:     Mental Status: He is alert and oriented to person, place, and time.     Deep Tendon Reflexes: Reflexes are normal and symmetric.  Psychiatric:        Behavior: Behavior normal.        Thought Content: Thought content normal.        Judgment: Judgment normal.       Assessment & Plan:   Edwin Taylor was seen today for medical management of chronic issues.  Diagnoses and all orders for this visit:  Left shoulder pain, unspecified chronicity -     DG Shoulder Left; Future -     DG Cervical Spine Complete; Future  Mixed hyperlipidemia -     CBC with Differential/Platelet -     CMP14+EGFR -     Lipid panel  Essential hypertension  Prediabetes -     Bayer DCA Hb A1c Waived  COPD mixed  type (HCC)  Vitamin D deficiency -     VITAMIN D 25 Hydroxy (Vit-D Deficiency, Fractures)  Other orders -     albuterol (PROAIR HFA) 108 (90 Base) MCG/ACT inhaler; Inhale 2 puffs into the lungs every 4 (four) hours as needed for wheezing or shortness of breath. -     allopurinol (ZYLOPRIM) 300 MG tablet; Take 1 tablet (300 mg total) by mouth daily. -     amLODipine (NORVASC) 5 MG tablet; Take 1 tablet (5 mg total) by mouth daily. -     atorvastatin (LIPITOR) 40 MG tablet; Take 1 tablet (40 mg total) by mouth daily. -  lisinopril (PRINIVIL,ZESTRIL) 10 MG tablet; Take 1 tablet (10 mg total) by mouth daily. -     Vitamin D, Ergocalciferol, (DRISDOL) 1.25 MG (50000 UT) CAPS capsule; Take 1 capsule (50,000 Units total) by mouth every 7 (seven) days. -     diclofenac (VOLTAREN) 75 MG EC tablet; Take 1 tablet (75 mg total) by mouth 2 (two) times daily. For muscle and  Joint pain   Allergies as of 08/30/2018   No Known Allergies     Medication List       Accurate as of August 30, 2018 10:35 PM. Always use your most recent med list.        albuterol 108 (90 Base) MCG/ACT inhaler Commonly known as:  PROAIR HFA Inhale 2 puffs into the lungs every 4 (four) hours as needed for wheezing or shortness of breath.   allopurinol 300 MG tablet Commonly known as:  ZYLOPRIM Take 1 tablet (300 mg total) by mouth daily.   amLODipine 5 MG tablet Commonly known as:  NORVASC Take 1 tablet (5 mg total) by mouth daily.   atorvastatin 40 MG tablet Commonly known as:  LIPITOR Take 1 tablet (40 mg total) by mouth daily.   colchicine 0.6 MG tablet For gout flare: 2 tabs once, 1 tab one hour later, then 1 tab daily for 5-7 days   diclofenac 75 MG EC tablet Commonly known as:  VOLTAREN Take 1 tablet (75 mg total) by mouth 2 (two) times daily. For muscle and  Joint pain   fluticasone furoate-vilanterol 200-25 MCG/INH Aepb Commonly known as:  BREO ELLIPTA Inhale 1 puff into the lungs daily.     lisinopril 10 MG tablet Commonly known as:  PRINIVIL,ZESTRIL Take 1 tablet (10 mg total) by mouth daily.   Vitamin D (Ergocalciferol) 1.25 MG (50000 UT) Caps capsule Commonly known as:  DRISDOL Take 1 capsule (50,000 Units total) by mouth every 7 (seven) days.       Meds ordered this encounter  Medications  . albuterol (PROAIR HFA) 108 (90 Base) MCG/ACT inhaler    Sig: Inhale 2 puffs into the lungs every 4 (four) hours as needed for wheezing or shortness of breath.    Dispense:  1 Inhaler    Refill:  2  . allopurinol (ZYLOPRIM) 300 MG tablet    Sig: Take 1 tablet (300 mg total) by mouth daily.    Dispense:  90 tablet    Refill:  1  . amLODipine (NORVASC) 5 MG tablet    Sig: Take 1 tablet (5 mg total) by mouth daily.    Dispense:  90 tablet    Refill:  1  . atorvastatin (LIPITOR) 40 MG tablet    Sig: Take 1 tablet (40 mg total) by mouth daily.    Dispense:  90 tablet    Refill:  1  . lisinopril (PRINIVIL,ZESTRIL) 10 MG tablet    Sig: Take 1 tablet (10 mg total) by mouth daily.    Dispense:  90 tablet    Refill:  1  . Vitamin D, Ergocalciferol, (DRISDOL) 1.25 MG (50000 UT) CAPS capsule    Sig: Take 1 capsule (50,000 Units total) by mouth every 7 (seven) days.    Dispense:  13 capsule    Refill:  1  . diclofenac (VOLTAREN) 75 MG EC tablet    Sig: Take 1 tablet (75 mg total) by mouth 2 (two) times daily. For muscle and  Joint pain    Dispense:  60 tablet    Refill:  2    Pt. Clearly has no interest in following a healthy diet whether for heart, prediabetes or otherwise. Will do all I can to minimize risk, but clearly, as a prediabetic, unless he loses weight he is very likely to develop diabetes soon. Consequences of obesity, as well as weight loss strategies were reviewed, and rejected bby Mr. Signorelli.  Follow-up: Return in about 6 months (around 02/28/2019).  Claretta Fraise, M.D.

## 2018-08-31 LAB — LIPID PANEL
CHOLESTEROL TOTAL: 131 mg/dL (ref 100–199)
Chol/HDL Ratio: 3.6 ratio (ref 0.0–5.0)
HDL: 36 mg/dL — ABNORMAL LOW (ref >39)
LDL CALC: 59 mg/dL (ref 0–99)
Triglycerides: 182 mg/dL — ABNORMAL HIGH (ref 0–149)
VLDL Cholesterol Cal: 36 mg/dL (ref 5–40)

## 2018-08-31 LAB — CBC WITH DIFFERENTIAL/PLATELET
BASOS ABS: 0.1 10*3/uL (ref 0.0–0.2)
Basos: 1 %
EOS (ABSOLUTE): 0.1 10*3/uL (ref 0.0–0.4)
EOS: 1 %
Hematocrit: 47.8 % (ref 37.5–51.0)
Hemoglobin: 16.2 g/dL (ref 13.0–17.7)
Immature Grans (Abs): 0.2 10*3/uL — ABNORMAL HIGH (ref 0.0–0.1)
Immature Granulocytes: 2 %
LYMPHS ABS: 2.2 10*3/uL (ref 0.7–3.1)
Lymphs: 19 %
MCH: 29.9 pg (ref 26.6–33.0)
MCHC: 33.9 g/dL (ref 31.5–35.7)
MCV: 88 fL (ref 79–97)
MONOS ABS: 0.8 10*3/uL (ref 0.1–0.9)
Monocytes: 7 %
Neutrophils Absolute: 7.9 10*3/uL — ABNORMAL HIGH (ref 1.4–7.0)
Neutrophils: 70 %
Platelets: 203 10*3/uL (ref 150–450)
RBC: 5.42 x10E6/uL (ref 4.14–5.80)
RDW: 12.3 % (ref 11.6–15.4)
WBC: 11.3 10*3/uL — AB (ref 3.4–10.8)

## 2018-08-31 LAB — CMP14+EGFR
ALK PHOS: 97 IU/L (ref 39–117)
ALT: 10 IU/L (ref 0–44)
AST: 10 IU/L (ref 0–40)
Albumin/Globulin Ratio: 2 (ref 1.2–2.2)
Albumin: 4.7 g/dL (ref 3.8–4.9)
BILIRUBIN TOTAL: 0.2 mg/dL (ref 0.0–1.2)
BUN/Creatinine Ratio: 10 (ref 9–20)
BUN: 13 mg/dL (ref 6–24)
CHLORIDE: 100 mmol/L (ref 96–106)
CO2: 23 mmol/L (ref 20–29)
CREATININE: 1.32 mg/dL — AB (ref 0.76–1.27)
Calcium: 9.3 mg/dL (ref 8.7–10.2)
GFR calc Af Amer: 68 mL/min/{1.73_m2} (ref >59)
GFR calc non Af Amer: 59 mL/min/{1.73_m2} — ABNORMAL LOW (ref >59)
GLOBULIN, TOTAL: 2.4 g/dL (ref 1.5–4.5)
Glucose: 117 mg/dL — ABNORMAL HIGH (ref 65–99)
POTASSIUM: 4.5 mmol/L (ref 3.5–5.2)
SODIUM: 142 mmol/L (ref 134–144)
Total Protein: 7.1 g/dL (ref 6.0–8.5)

## 2018-09-01 LAB — SPECIMEN STATUS REPORT

## 2018-09-01 LAB — VITAMIN D 25 HYDROXY (VIT D DEFICIENCY, FRACTURES): Vit D, 25-Hydroxy: 42.2 ng/mL (ref 30.0–100.0)

## 2018-09-23 ENCOUNTER — Encounter: Payer: Self-pay | Admitting: *Deleted

## 2018-09-28 ENCOUNTER — Encounter: Payer: Self-pay | Admitting: Family Medicine

## 2018-09-28 ENCOUNTER — Ambulatory Visit (INDEPENDENT_AMBULATORY_CARE_PROVIDER_SITE_OTHER): Payer: 59 | Admitting: Family Medicine

## 2018-09-28 VITALS — BP 134/77 | HR 77 | Temp 98.2°F | Ht 67.0 in | Wt 222.2 lb

## 2018-09-28 DIAGNOSIS — R059 Cough, unspecified: Secondary | ICD-10-CM

## 2018-09-28 DIAGNOSIS — R05 Cough: Secondary | ICD-10-CM

## 2018-09-28 DIAGNOSIS — J441 Chronic obstructive pulmonary disease with (acute) exacerbation: Secondary | ICD-10-CM

## 2018-09-28 MED ORDER — LEVOFLOXACIN 500 MG PO TABS: 500.0000 mg | ORAL_TABLET | Freq: Every day | ORAL | 0 refills | Status: DC

## 2018-09-28 MED ORDER — BETAMETHASONE SOD PHOS & ACET 6 (3-3) MG/ML IJ SUSP: 6.0000 mg | Freq: Once | INTRAMUSCULAR | Status: DC

## 2018-09-28 MED ORDER — HYDROCODONE-HOMATROPINE 5-1.5 MG/5ML PO SYRP: 5.0000 mL | ORAL_SOLUTION | Freq: Four times a day (QID) | ORAL | 0 refills | Status: AC | PRN

## 2018-09-28 NOTE — Progress Notes (Signed)
Chief Complaint  Patient presents with  . Cough    pt here today c/o cough and congestion    HPI  Patient presents today for Patient presents with upper respiratory congestion. Rhinorrhea that is frequently purulent. There is moderate sore throat. Patient reports coughing frequently as well. Yellowsputum noted. There is fever to 101.6 persistent for the last three days.  The patient denies being short of breath, except briefly with cough paroxysms. Onset was 5 days ago. Gradually worsening. Tried OTCs without improvement.  PMH: Smoking status noted ROS: Per HPI  Objective: BP 134/77   Pulse 77   Temp 98.2 F (36.8 C) (Oral)   Ht 5\' 7"  (1.702 m)   Wt 222 lb 4 oz (100.8 kg)   BMI 34.81 kg/m  Gen: NAD, alert, cooperative with exam HEENT: NCAT, Nasal passages swollen, red TMS clear CV: RRR, good S1/S2, no murmur Resp: Bronchitis changes with RUL wheezes, non-labored Ext: No edema, warm Neuro: Alert and oriented, No gross deficits  Assessment and plan:  1. Cough   2. Acute exacerbation of chronic obstructive pulmonary disease (COPD) (HCC)     Meds ordered this encounter  Medications  . betamethasone acetate-betamethasone sodium phosphate (CELESTONE) injection 6 mg  . levofloxacin (LEVAQUIN) 500 MG tablet    Sig: Take 1 tablet (500 mg total) by mouth daily. For 10 days    Dispense:  10 tablet    Refill:  0  . HYDROcodone-homatropine (HYCODAN) 5-1.5 MG/5ML syrup    Sig: Take 5 mLs by mouth every 6 (six) hours as needed for up to 5 days for cough.    Dispense:  100 mL    Refill:  0    No orders of the defined types were placed in this encounter.   Follow up as needed.  Mechele Claude, MD

## 2019-02-28 ENCOUNTER — Ambulatory Visit: Payer: 59 | Admitting: Family Medicine

## 2019-03-08 ENCOUNTER — Ambulatory Visit (INDEPENDENT_AMBULATORY_CARE_PROVIDER_SITE_OTHER): Payer: 59 | Admitting: Family Medicine

## 2019-03-08 ENCOUNTER — Encounter: Payer: Self-pay | Admitting: Family Medicine

## 2019-03-08 DIAGNOSIS — E782 Mixed hyperlipidemia: Secondary | ICD-10-CM | POA: Diagnosis not present

## 2019-03-08 DIAGNOSIS — J449 Chronic obstructive pulmonary disease, unspecified: Secondary | ICD-10-CM

## 2019-03-08 DIAGNOSIS — I10 Essential (primary) hypertension: Secondary | ICD-10-CM

## 2019-03-08 NOTE — Progress Notes (Signed)
Subjective:    Patient ID: Edwin Taylor, male    DOB: [birth year], 59 y.o.   MRN: [id number]   HPI: Edwin ConstantFrank Allen Taylor is a 58 y.o. male presenting for possible exposure to COVID. Boss did test that was negative. Pt.s son works in building where there are positive cases. He is self  Isolating curently in thepatient's home.   Doesn't monitor BP, but his wife can for him. (She is a CNA)  Using inhaler only when walking a lot in high heat.  Otherwise denies any problems with exertional or rest dyspnea.  Patient in for follow-up of elevated cholesterol. Doing well without complaints on current medication. Denies side effects of statin including myalgia and arthralgia and nausea. Also in today for liver function testing. Currently no chest pain, shortness of breath or other cardiovascular related symptoms noted.   Follow-up of hypertension. Patient has no history of headache chest pain or shortness of breath or recent cough. Patient also denies symptoms of TIA such as numbness weakness lateralizing. Patient checks  blood pressure at home and has not had any elevated readings recently. Patient denies side effects from his medication. States taking it regularly.      [facility] 2/9 08/30/2018 02/25/2018 08/27/2017 07/05/2017 02/24/2017  Decreased Interest 0 0 0 0 0  Down, Depressed, Hopeless 0 0 0 0 0  PHQ - 2 Score 0 0 0 0 0     Relevant past medical, surgical, family and social history reviewed and updated as indicated.  Interim medical history since our last visit reviewed. Allergies and medications reviewed and updated.  ROS:  Review of Systems  Constitutional: Negative.   HENT: Negative.   Eyes: Negative for visual disturbance.  Respiratory: Negative for cough and shortness of breath.   Cardiovascular: Negative for chest pain and leg swelling.  Gastrointestinal: Negative for abdominal pain, diarrhea, nausea and vomiting.  Genitourinary: Negative for difficulty  urinating.  Musculoskeletal: Negative for arthralgias and myalgias.  Skin: Negative for rash.  Neurological: Negative for headaches.  Psychiatric/Behavioral: Negative for sleep disturbance.     Social History   Tobacco Use  Smoking Status Current Every Day Smoker  . Packs/day: 2.00  . Types: Cigarettes  Smokeless Tobacco Never Used       Objective:     Wt Readings from Last 3 Encounters:  09/28/18 222 lb 4 oz (100.8 kg)  08/30/18 223 lb 8 oz (101.4 kg)  02/25/18 223 lb (101.2 kg)     Exam deferred. Pt. Harboring due to COVID 19. Phone visit performed.   Assessment & Plan:   1. Essential hypertension   2. Mixed hyperlipidemia   3. COPD mixed type (HCC)     No orders of the defined types were placed in this encounter.   No orders of the defined types were placed in this encounter.     Diagnoses and all orders for this visit:  Essential hypertension  Mixed hyperlipidemia  COPD mixed type (HCC)    Virtual Visit via telephone Note  I discussed the limitations, risks, security and privacy concerns of performing an evaluation and management service by telephone and the availability of in person appointments. The patient was identified with two identifiers. Pt.expressed understanding and agreed to proceed. Pt. Is at home. Dr. Darlyn ReadStacks is in his office.  Follow Up Instructions:   I discussed the assessment and treatment plan with the patient. The patient was provided an opportunity to ask questions and all were answered. The patient  agreed with the plan and demonstrated an understanding of the instructions.   The patient was advised to call back or seek an in-person evaluation if the symptoms worsen or if the condition fails to improve as anticipated.   Total minutes including chart review and phone contact time: 22   Follow up plan: Return in about 6 months (around 09/08/2019) for Wellness, hypertension, cholesterol, COPD.  Claretta Fraise, MD [place]

## 2019-03-16 ENCOUNTER — Other Ambulatory Visit: Payer: Self-pay | Admitting: Family Medicine

## 2019-04-01 ENCOUNTER — Other Ambulatory Visit: Payer: Self-pay | Admitting: Family Medicine

## 2019-04-29 ENCOUNTER — Other Ambulatory Visit: Payer: Self-pay | Admitting: Family Medicine

## 2019-09-22 ENCOUNTER — Other Ambulatory Visit: Payer: Self-pay | Admitting: Family Medicine

## 2019-09-26 ENCOUNTER — Ambulatory Visit (INDEPENDENT_AMBULATORY_CARE_PROVIDER_SITE_OTHER): Payer: 59 | Admitting: Family Medicine

## 2019-09-26 ENCOUNTER — Encounter: Payer: Self-pay | Admitting: Family Medicine

## 2019-09-26 DIAGNOSIS — I10 Essential (primary) hypertension: Secondary | ICD-10-CM | POA: Diagnosis not present

## 2019-09-26 DIAGNOSIS — J449 Chronic obstructive pulmonary disease, unspecified: Secondary | ICD-10-CM | POA: Diagnosis not present

## 2019-09-26 DIAGNOSIS — R7303 Prediabetes: Secondary | ICD-10-CM

## 2019-09-26 DIAGNOSIS — M109 Gout, unspecified: Secondary | ICD-10-CM

## 2019-09-26 DIAGNOSIS — E782 Mixed hyperlipidemia: Secondary | ICD-10-CM

## 2019-09-26 DIAGNOSIS — E559 Vitamin D deficiency, unspecified: Secondary | ICD-10-CM

## 2019-09-26 MED ORDER — ATORVASTATIN CALCIUM 40 MG PO TABS: 40.0000 mg | ORAL_TABLET | Freq: Every day | ORAL | 1 refills | Status: DC

## 2019-09-26 MED ORDER — LISINOPRIL 10 MG PO TABS: 10.0000 mg | ORAL_TABLET | Freq: Every day | ORAL | 1 refills | Status: DC

## 2019-09-26 MED ORDER — AMLODIPINE BESYLATE 5 MG PO TABS: 5.0000 mg | ORAL_TABLET | Freq: Every day | ORAL | 1 refills | Status: DC

## 2019-09-26 MED ORDER — ALLOPURINOL 300 MG PO TABS: 300.0000 mg | ORAL_TABLET | Freq: Every day | ORAL | 1 refills | Status: DC

## 2019-09-26 MED ORDER — VITAMIN D (ERGOCALCIFEROL) 1.25 MG (50000 UNIT) PO CAPS: 50000.0000 [IU] | ORAL_CAPSULE | ORAL | 1 refills | Status: DC

## 2019-09-26 MED ORDER — ALBUTEROL SULFATE HFA 108 (90 BASE) MCG/ACT IN AERS: 2.0000 | INHALATION_SPRAY | RESPIRATORY_TRACT | 1 refills | Status: DC | PRN

## 2019-09-26 NOTE — Progress Notes (Signed)
Subjective:    Patient ID: Edwin Taylor, male    DOB: 10-Nov-1960, 59 y.o.   MRN: [id number]   HPI: Edwin Taylor is a 59 y.o. male presenting for  presents for  follow-up of hypertension. Patient has no history of headache chest pain or shortness of breath or recent cough. Patient also denies symptoms of TIA such as focal numbness or weakness. Patient denies side effects from medication. States taking it regularly. 120/70.   Concerned about decreased stamina.Not as much energy as he had in the past for performing strenuous tasks.    Depression screen [facility] 2/9 08/30/2018 02/25/2018 08/27/2017 07/05/2017 02/24/2017  Decreased Interest 0 0 0 0 0  Down, Depressed, Hopeless 0 0 0 0 0  PHQ - 2 Score 0 0 0 0 0     Relevant past medical, surgical, family and social history reviewed and updated as indicated.  Interim medical history since our last visit reviewed. Allergies and medications reviewed and updated.  ROS:  Review of Systems  Constitutional: Negative.   HENT: Negative.   Eyes: Negative for visual disturbance.  Respiratory: Negative for cough and shortness of breath.   Cardiovascular: Negative for chest pain and leg swelling.  Gastrointestinal: Negative for abdominal pain, diarrhea, nausea and vomiting.  Endocrine: Positive for polyuria.  Genitourinary: Positive for frequency (daytime). Negative for difficulty urinating.  Musculoskeletal: Negative for arthralgias and myalgias.  Skin: Negative for rash.  Neurological: Negative for headaches.  Psychiatric/Behavioral: Negative for sleep disturbance.     Social History   Tobacco Use  Smoking Status Current Every Day Smoker  . Packs/day: 2.00  . Types: Cigarettes  Smokeless Tobacco Never Used       Objective:     Wt Readings from Last 3 Encounters:  09/28/18 222 lb 4 oz (100.8 kg)  08/30/18 223 lb 8 oz (101.4 kg)  02/25/18 223 lb (101.2 kg)     Exam deferred. Pt. Harboring due to COVID 19. Phone visit  performed.   Assessment & Plan:   1. COPD mixed type ([place])   2. Pre-diabetes   3. Gout of multiple sites, unspecified cause, unspecified chronicity   4. Essential hypertension   5. Mixed hyperlipidemia   6. Vitamin D deficiency     Meds ordered this encounter  Medications  . amLODipine (NORVASC) 5 MG tablet    Sig: Take 1 tablet (5 mg total) by mouth daily.    Dispense:  90 tablet    Refill:  1  . lisinopril (ZESTRIL) 10 MG tablet    Sig: Take 1 tablet (10 mg total) by mouth daily.    Dispense:  90 tablet    Refill:  1  . Vitamin D, Ergocalciferol, (DRISDOL) 1.25 MG (50000 UNIT) CAPS capsule    Sig: Take 1 capsule (50,000 Units total) by mouth once a week.    Dispense:  13 capsule    Refill:  1  . atorvastatin (LIPITOR) 40 MG tablet    Sig: Take 1 tablet (40 mg total) by mouth daily.    Dispense:  90 tablet    Refill:  1  . allopurinol (ZYLOPRIM) 300 MG tablet    Sig: Take 1 tablet (300 mg total) by mouth daily.    Dispense:  90 tablet    Refill:  1  . albuterol (PROAIR HFA) 108 (90 Base) MCG/ACT inhaler    Sig: Inhale 2 puffs into the lungs every 4 (four) hours as needed for wheezing or shortness of breath.  Dispense:  18 g    Refill:  1    Orders Placed This Encounter  Procedures  . CBC with Differential/Platelet  . CMP14+EGFR    Order Specific Question:   Has the patient fasted?    Answer:   Yes  . Lipid panel    Order Specific Question:   Has the patient fasted?    Answer:   Yes      Diagnoses and all orders for this visit:  COPD mixed type ([place]) -     albuterol (PROAIR HFA) 108 (90 Base) MCG/ACT inhaler; Inhale 2 puffs into the lungs every 4 (four) hours as needed for wheezing or shortness of breath. -     CBC with Differential/Platelet -     CMP14+EGFR  Pre-diabetes -     CBC with Differential/Platelet -     CMP14+EGFR  Gout of multiple sites, unspecified cause, unspecified chronicity -     allopurinol (ZYLOPRIM) 300 MG tablet; Take 1 tablet  (300 mg total) by mouth daily. -     CBC with Differential/Platelet -     CMP14+EGFR  Essential hypertension -     amLODipine (NORVASC) 5 MG tablet; Take 1 tablet (5 mg total) by mouth daily. -     lisinopril (ZESTRIL) 10 MG tablet; Take 1 tablet (10 mg total) by mouth daily. -     CBC with Differential/Platelet -     CMP14+EGFR  Mixed hyperlipidemia -     atorvastatin (LIPITOR) 40 MG tablet; Take 1 tablet (40 mg total) by mouth daily. -     CBC with Differential/Platelet -     CMP14+EGFR -     Lipid panel  Vitamin D deficiency -     Vitamin D, Ergocalciferol, (DRISDOL) 1.25 MG (50000 UNIT) CAPS capsule; Take 1 capsule (50,000 Units total) by mouth once a week.  Patient with multiple medical conditions.  His main concern today is with his stamina.  He just cannot perform strenuous tasks up to the level he has before.  He is more fatigued when he finishes carrying a moderately heavy object for 50 feet or so.  This used to be easy for him.  Of note is that he is a smoker.  He has not shown any desire to quit during our discussions over time.  It is also possible that he has developed some anemia and/or thyroid condition that is decreasing his stamina.  We will test for these for now.  Consider further evaluation of his heart and lungs particularly if he develops chest pain or shortness of breath which are currently not present.  Do continue to try to have emphasize risk management with regard to cardiovascular and cerebrovascular Conditions with blood pressure and cholesterol management.  He has been in the prediabetic range in the past we will go ahead and check that again today as well.  Virtual Visit via telephone Note  I discussed the limitations, risks, security and privacy concerns of performing an evaluation and management service by telephone and the availability of in person appointments. The patient was identified with two identifiers. Pt.expressed understanding and agreed to proceed.  Pt. Is at home. Dr. Livia Snellen is in his office.  Follow Up Instructions:   I discussed the assessment and treatment plan with the patient. The patient was provided an opportunity to ask questions and all were answered. The patient agreed with the plan and demonstrated an understanding of the instructions.   The patient was advised to call back or  seek an in-person evaluation if the symptoms worsen or if the condition fails to improve as anticipated.   Total minutes including chart review and phone contact time: 26   Follow up plan: Return in about 6 months (around 03/25/2020), or if symptoms worsen or fail to improve.  Claretta Fraise, MD [place]

## 2019-11-30 ENCOUNTER — Other Ambulatory Visit: Payer: Self-pay | Admitting: Family Medicine

## 2020-03-05 ENCOUNTER — Other Ambulatory Visit: Payer: Self-pay | Admitting: Family Medicine

## 2020-03-05 DIAGNOSIS — J449 Chronic obstructive pulmonary disease, unspecified: Secondary | ICD-10-CM

## 2020-08-14 ENCOUNTER — Encounter: Payer: Self-pay | Admitting: Family

## 2020-08-14 ENCOUNTER — Ambulatory Visit (INDEPENDENT_AMBULATORY_CARE_PROVIDER_SITE_OTHER): Payer: Self-pay | Admitting: Family

## 2020-08-14 DIAGNOSIS — Z20822 Contact with and (suspected) exposure to covid-19: Secondary | ICD-10-CM

## 2020-08-14 LAB — VERITOR FLU A/B WAIVED
Influenza A: NEGATIVE
Influenza B: NEGATIVE

## 2020-08-14 MED ORDER — DEXAMETHASONE 6 MG PO TABS: 6.0000 mg | ORAL_TABLET | Freq: Two times a day (BID) | ORAL | 0 refills | Status: DC

## 2020-08-14 NOTE — Progress Notes (Signed)
   Virtual Visit via telephone Note Due to COVID-19 pandemic this visit was conducted virtually. This visit type was conducted due to national recommendations for restrictions regarding the COVID-19 Pandemic (e.g. social distancing, sheltering in place) in an effort to limit this patient's exposure and mitigate transmission in our community. All issues noted in this document were discussed and addressed.  A physical exam was not performed with this format.  I connected with Edwin Taylor on 08/14/20 at 12:13 pm  by telephone and verified that I am speaking with the correct person using two identifiers. Edwin Taylor is currently located at home and his wife  is currently with him during visit. The provider, Jannifer Rodney, FNP is located in their office at time of visit.  I discussed the limitations, risks, security and privacy concerns of performing an evaluation and management service by telephone and the availability of in person appointments. I also discussed with the patient that there may be a patient responsible charge related to this service. The patient expressed understanding and agreed to proceed.   History and Present Illness:  Cough This is a new problem. The current episode started in the past 7 days. The problem has been gradually worsening. The problem occurs every few minutes. The cough is productive of sputum. Associated symptoms include chills, a fever (101), headaches, myalgias, nasal congestion and postnasal drip. Pertinent negatives include no sore throat or shortness of breath.      Review of Systems  Constitutional: Positive for chills and fever (101).  HENT: Positive for postnasal drip. Negative for sore throat.   Respiratory: Positive for cough. Negative for shortness of breath.   Musculoskeletal: Positive for myalgias.  Neurological: Positive for headaches.  All other systems reviewed and are negative.    Observations/Objective: No SOB or distress noted,  hoarse voice   Assessment and Plan: 1. Encounter by telehealth for suspected COVID-19 Pt will come get COVID and flu tested Quarantine    until results return Rest Tylenol  - dexamethasone (DECADRON) 6 MG tablet; Take 1 tablet (6 mg total) by mouth 2 (two) times daily.  Dispense: 14 tablet; Refill: 0 - MyChart COVID-19 home monitoring program; Future - Novel Coronavirus, NAA ([facility]) - Veritor Flu A/B Waived     I discussed the assessment and treatment plan with the patient. The patient was provided an opportunity to ask questions and all were answered. The patient agreed with the plan and demonstrated an understanding of the instructions.   The patient was advised to call back or seek an in-person evaluation if the symptoms worsen or if the condition fails to improve as anticipated.  The above assessment and management plan was discussed with the patient. The patient verbalized understanding of and has agreed to the management plan. Patient is aware to call the clinic if symptoms persist or worsen. Patient is aware when to return to the clinic for a follow-up visit. Patient educated on when it is appropriate to go to the emergency department.   Time call ended:  12:25 pm   I provided 12 minutes of non-face-to-face time during this encounter.    Jannifer Rodney, FNP

## 2020-08-17 LAB — SARS-COV-2, NAA 2 DAY TAT

## 2020-08-17 LAB — NOVEL CORONAVIRUS, NAA: SARS-CoV-2, NAA: DETECTED — AB

## 2020-08-19 NOTE — Progress Notes (Signed)
LMTRC,1/17,AH

## 2020-08-21 ENCOUNTER — Other Ambulatory Visit: Payer: Self-pay

## 2020-08-21 DIAGNOSIS — Z1152 Encounter for screening for COVID-19: Secondary | ICD-10-CM

## 2020-08-23 ENCOUNTER — Telehealth: Payer: Self-pay | Admitting: Family Medicine

## 2020-08-23 LAB — NOVEL CORONAVIRUS, NAA: SARS-CoV-2, NAA: NOT DETECTED

## 2020-08-23 LAB — SARS-COV-2, NAA 2 DAY TAT

## 2020-08-23 NOTE — Telephone Encounter (Signed)
Christy, please advise. 

## 2020-08-23 NOTE — Telephone Encounter (Signed)
Pt needs note for work saying that COVID results are neg and he can return to work

## 2020-08-26 ENCOUNTER — Encounter: Payer: Self-pay | Admitting: Family

## 2020-08-26 NOTE — Telephone Encounter (Signed)
Letter written and placed up front in drawer for pat to pick up. VM box full could note leave message.

## 2020-08-26 NOTE — Telephone Encounter (Signed)
Ok, to give note.

## 2020-10-04 ENCOUNTER — Other Ambulatory Visit: Payer: Self-pay | Admitting: Family Medicine

## 2020-10-04 DIAGNOSIS — E782 Mixed hyperlipidemia: Secondary | ICD-10-CM

## 2020-10-11 ENCOUNTER — Other Ambulatory Visit: Payer: Self-pay | Admitting: Family Medicine

## 2020-10-11 DIAGNOSIS — M109 Gout, unspecified: Secondary | ICD-10-CM

## 2020-10-11 DIAGNOSIS — I10 Essential (primary) hypertension: Secondary | ICD-10-CM

## 2020-10-11 DIAGNOSIS — J449 Chronic obstructive pulmonary disease, unspecified: Secondary | ICD-10-CM

## 2020-10-18 ENCOUNTER — Other Ambulatory Visit: Payer: Self-pay | Admitting: Family Medicine

## 2020-10-18 DIAGNOSIS — J449 Chronic obstructive pulmonary disease, unspecified: Secondary | ICD-10-CM

## 2020-10-18 DIAGNOSIS — M109 Gout, unspecified: Secondary | ICD-10-CM

## 2020-10-18 DIAGNOSIS — I10 Essential (primary) hypertension: Secondary | ICD-10-CM

## 2020-10-18 NOTE — Telephone Encounter (Signed)
Patient last seen 09/26/19. Patient needs an appointment

## 2020-10-22 ENCOUNTER — Other Ambulatory Visit: Payer: Self-pay | Admitting: Family Medicine

## 2020-10-22 DIAGNOSIS — I10 Essential (primary) hypertension: Secondary | ICD-10-CM

## 2020-10-22 DIAGNOSIS — M109 Gout, unspecified: Secondary | ICD-10-CM

## 2020-10-22 DIAGNOSIS — J449 Chronic obstructive pulmonary disease, unspecified: Secondary | ICD-10-CM

## 2020-10-23 NOTE — Telephone Encounter (Signed)
NA/NVM, VM full, refills sent to pharmacy

## 2020-10-31 ENCOUNTER — Encounter: Payer: Self-pay | Admitting: Family Medicine

## 2020-10-31 ENCOUNTER — Other Ambulatory Visit: Payer: Self-pay

## 2020-10-31 ENCOUNTER — Ambulatory Visit (INDEPENDENT_AMBULATORY_CARE_PROVIDER_SITE_OTHER): Payer: Self-pay | Admitting: Family Medicine

## 2020-10-31 VITALS — BP 136/76 | HR 62 | Temp 97.8°F | Ht 67.0 in | Wt 216.2 lb

## 2020-10-31 DIAGNOSIS — Z1159 Encounter for screening for other viral diseases: Secondary | ICD-10-CM

## 2020-10-31 DIAGNOSIS — Z20822 Contact with and (suspected) exposure to covid-19: Secondary | ICD-10-CM

## 2020-10-31 DIAGNOSIS — E782 Mixed hyperlipidemia: Secondary | ICD-10-CM

## 2020-10-31 DIAGNOSIS — Z114 Encounter for screening for human immunodeficiency virus [HIV]: Secondary | ICD-10-CM

## 2020-10-31 DIAGNOSIS — Z125 Encounter for screening for malignant neoplasm of prostate: Secondary | ICD-10-CM

## 2020-10-31 DIAGNOSIS — F17200 Nicotine dependence, unspecified, uncomplicated: Secondary | ICD-10-CM

## 2020-10-31 DIAGNOSIS — M109 Gout, unspecified: Secondary | ICD-10-CM

## 2020-10-31 DIAGNOSIS — I10 Essential (primary) hypertension: Secondary | ICD-10-CM

## 2020-10-31 DIAGNOSIS — J449 Chronic obstructive pulmonary disease, unspecified: Secondary | ICD-10-CM

## 2020-10-31 DIAGNOSIS — E559 Vitamin D deficiency, unspecified: Secondary | ICD-10-CM

## 2020-10-31 MED ORDER — COLCHICINE 0.6 MG PO TABS: ORAL_TABLET | ORAL | 2 refills | Status: DC

## 2020-10-31 MED ORDER — ALLOPURINOL 300 MG PO TABS: 300.0000 mg | ORAL_TABLET | Freq: Every day | ORAL | 1 refills | Status: DC

## 2020-10-31 MED ORDER — ATORVASTATIN CALCIUM 40 MG PO TABS: 40.0000 mg | ORAL_TABLET | Freq: Every day | ORAL | 1 refills | Status: DC

## 2020-10-31 MED ORDER — ALBUTEROL SULFATE HFA 108 (90 BASE) MCG/ACT IN AERS: INHALATION_SPRAY | RESPIRATORY_TRACT | 11 refills | Status: DC

## 2020-10-31 MED ORDER — AMLODIPINE BESYLATE 5 MG PO TABS: 5.0000 mg | ORAL_TABLET | Freq: Every day | ORAL | 1 refills | Status: DC

## 2020-10-31 MED ORDER — LISINOPRIL 10 MG PO TABS: 10.0000 mg | ORAL_TABLET | Freq: Every day | ORAL | 3 refills | Status: DC

## 2020-10-31 MED ORDER — DEXAMETHASONE 6 MG PO TABS: 6.0000 mg | ORAL_TABLET | Freq: Two times a day (BID) | ORAL | 0 refills | Status: DC

## 2020-10-31 NOTE — Progress Notes (Signed)
Subjective:  Patient ID: Edwin Taylor, male    DOB: 1960/12/15  Age: 61 y.o. MRN: [id number]  CC: Medical Management of Chronic Issues   HPI Edwin Taylor presents for  follow-up of hypertension.  Patient says he is just here because he wants to get his medicines refilled.  However he tells me that his medicines are just a crutch and they are not helpful.  He hates taking pills and will resist it all he can.  He tells me openly that he is irregular about all of his medications that he might take midday skip a couple of days then take him a couple of days then skip for a while.  He does not believe that they are useful for extending life or quality of life based on discussion.  He is coughing ever since he had the Covid.  He has paroxysms where he feels like he is about to pass out sometimes.  He is coughing up copious amounts of sputum intermittently.  He is smoking 1/2 pack to 1 pack a day now.  He says that is quitting.  He used to smoke 1-1/2 to 2 packs a day.  Additionally he tells me that his weight is just right.  At his current weight he has not varied over a pound or 2 in over a decade which tells him that his body needs to be that weight and all the tables doctors use do not make any sense. History Edwin Taylor has a past medical history of Acute exacerbation of chronic obstructive pulmonary disease (COPD) ([place]) (07/05/2017), Gout, Hernia, Hyperlipidemia, Hypertension, Obesity, and Tobacco use.   He has no past surgical history on file.   His family history includes Arthritis in his mother; Birth defects in his sister; Cancer (age of onset: 74) in his father; Dementia in his maternal grandmother; Diabetes in his father, maternal grandfather, maternal grandmother, and paternal grandmother; Heart disease in his maternal grandfather and paternal grandmother.He reports that he has been smoking cigarettes. He has been smoking about 2.00 packs per day. He has never used smokeless tobacco. He reports  current alcohol use of about 18.0 standard drinks of alcohol per week. He reports that he does not use drugs.  No current outpatient medications on file prior to visit.   Current Facility-Administered Medications on File Prior to Visit  Medication Dose Route Frequency Provider Last Rate Last Admin  . betamethasone acetate-betamethasone sodium phosphate (CELESTONE) injection 6 mg  6 mg Intramuscular Once Claretta Fraise, MD        ROS Review of Systems  Constitutional: Negative for fever.  Respiratory: Negative for shortness of breath.   Cardiovascular: Negative for chest pain.  Musculoskeletal: Negative for arthralgias.  Skin: Negative for rash.    Objective:  BP 136/76   Pulse 62   Temp 97.8 F (36.6 C)   Ht '5\' 7"'  (1.702 m)   Wt 216 lb 3.2 oz (98.1 kg)   SpO2 93%   BMI 33.86 kg/m   BP Readings from Last 3 Encounters:  10/31/20 136/76  09/28/18 134/77  08/30/18 133/85    Wt Readings from Last 3 Encounters:  10/31/20 216 lb 3.2 oz (98.1 kg)  09/28/18 222 lb 4 oz (100.8 kg)  08/30/18 223 lb 8 oz (101.4 kg)     Physical Exam Vitals reviewed.  Constitutional:      Appearance: He is well-developed.  HENT:     Head: Normocephalic and atraumatic.     Right Ear: External ear  normal.     Left Ear: External ear normal.     Mouth/Throat:     Pharynx: No oropharyngeal exudate or posterior oropharyngeal erythema.  Eyes:     Pupils: Pupils are equal, round, and reactive to light.  Cardiovascular:     Rate and Rhythm: Normal rate and regular rhythm.     Heart sounds: No murmur heard.   Pulmonary:     Effort: No respiratory distress.     Breath sounds: Wheezing present.  Musculoskeletal:     Cervical back: Normal range of motion and neck supple.  Neurological:     Mental Status: He is alert and oriented to person, place, and time.  Psychiatric:        Attention and Perception: Attention normal.        Mood and Affect: Affect is angry.        Speech: Speech normal.         Behavior: Behavior is agitated and aggressive.        Judgment: Judgment is inappropriate.       Assessment & Plan:   Edwin Taylor was seen today for medical management of chronic issues.  Diagnoses and all orders for this visit:  Primary hypertension -     CBC with Differential/Platelet -     CMP14+EGFR -     Lipid panel  COPD mixed type (HCC) -     albuterol (VENTOLIN HFA) 108 (90 Base) MCG/ACT inhaler; INHALE 2 PUFFS BY MOUTH EVERY 4 HOURS AS NEEDED FOR WHEEZING FOR SHORTNESS OF BREATH -     CBC with Differential/Platelet -     CMP14+EGFR -     Lipid panel  Gout of multiple sites, unspecified cause, unspecified chronicity -     allopurinol (ZYLOPRIM) 300 MG tablet; Take 1 tablet (300 mg total) by mouth daily. -     CBC with Differential/Platelet -     CMP14+EGFR -     Lipid panel  Essential hypertension -     amLODipine (NORVASC) 5 MG tablet; Take 1 tablet (5 mg total) by mouth daily. -     lisinopril (ZESTRIL) 10 MG tablet; Take 1 tablet (10 mg total) by mouth daily. -     CBC with Differential/Platelet -     CMP14+EGFR -     Lipid panel  Mixed hyperlipidemia -     atorvastatin (LIPITOR) 40 MG tablet; Take 1 tablet (40 mg total) by mouth daily. -     CBC with Differential/Platelet -     CMP14+EGFR -     Lipid panel  Tobacco use disorder -     CBC with Differential/Platelet -     CMP14+EGFR -     Lipid panel  Vitamin D deficiency -     VITAMIN D 25 Hydroxy (Vit-D Deficiency, Fractures)  Encounter for screening for HIV -     HIV Antibody (routine testing w rflx)  Need for hepatitis C screening test -     Hepatitis C antibody  Screening for prostate cancer -     PSA, total and free  Encounter by telehealth for suspected COVID-19 -     dexamethasone (DECADRON) 6 MG tablet; Take 1 tablet (6 mg total) by mouth 2 (two) times daily.  Other orders -     colchicine 0.6 MG tablet; TAKE ONE TABLET BY MOUTH ONCE DAILY   Allergies as of 10/31/2020   No Known  Allergies     Medication List  Accurate as of October 31, 2020  8:27 AM. If you have any questions, ask your nurse or doctor.        STOP taking these medications   diclofenac 75 MG EC tablet Commonly known as: VOLTAREN Stopped by: Claretta Fraise, MD   Vitamin D (Ergocalciferol) 1.25 MG (50000 UNIT) Caps capsule Commonly known as: DRISDOL Stopped by: Claretta Fraise, MD     TAKE these medications   albuterol 108 (90 Base) MCG/ACT inhaler Commonly known as: VENTOLIN HFA INHALE 2 PUFFS BY MOUTH EVERY 4 HOURS AS NEEDED FOR WHEEZING FOR SHORTNESS OF BREATH   allopurinol 300 MG tablet Commonly known as: ZYLOPRIM Take 1 tablet (300 mg total) by mouth daily.   amLODipine 5 MG tablet Commonly known as: NORVASC Take 1 tablet (5 mg total) by mouth daily.   atorvastatin 40 MG tablet Commonly known as: LIPITOR Take 1 tablet (40 mg total) by mouth daily.   colchicine 0.6 MG tablet TAKE ONE TABLET BY MOUTH ONCE DAILY   dexamethasone 6 MG tablet Commonly known as: DECADRON Take 1 tablet (6 mg total) by mouth 2 (two) times daily.   lisinopril 10 MG tablet Commonly known as: ZESTRIL Take 1 tablet (10 mg total) by mouth daily.       Meds ordered this encounter  Medications  . albuterol (VENTOLIN HFA) 108 (90 Base) MCG/ACT inhaler    Sig: INHALE 2 PUFFS BY MOUTH EVERY 4 HOURS AS NEEDED FOR WHEEZING FOR SHORTNESS OF BREATH    Dispense:  18 g    Refill:  11  . allopurinol (ZYLOPRIM) 300 MG tablet    Sig: Take 1 tablet (300 mg total) by mouth daily.    Dispense:  90 tablet    Refill:  1  . amLODipine (NORVASC) 5 MG tablet    Sig: Take 1 tablet (5 mg total) by mouth daily.    Dispense:  90 tablet    Refill:  1  . atorvastatin (LIPITOR) 40 MG tablet    Sig: Take 1 tablet (40 mg total) by mouth daily.    Dispense:  90 tablet    Refill:  1  . colchicine 0.6 MG tablet    Sig: TAKE ONE TABLET BY MOUTH ONCE DAILY    Dispense:  10 tablet    Refill:  2  . lisinopril  (ZESTRIL) 10 MG tablet    Sig: Take 1 tablet (10 mg total) by mouth daily.    Dispense:  90 tablet    Refill:  3  . dexamethasone (DECADRON) 6 MG tablet    Sig: Take 1 tablet (6 mg total) by mouth 2 (two) times daily.    Dispense:  14 tablet    Refill:  0    In the time of numbness to rule out he has always been somewhat hostile.  This seems to be growing.  He also is openly noncompliant with treatment.  I tried to encourage him to quit smoking and lose weight.  At both counts he makes it clear that medical science is an adequate in recommendations in general.  Discussion of his other conditions is met with similar skepticism.  Follow-up: Return in about 1 year (around 10/31/2021) for hypertension, cholesterol.  Claretta Fraise, M.D.

## 2020-11-01 LAB — CMP14+EGFR
ALT: 10 IU/L (ref 0–44)
AST: 17 IU/L (ref 0–40)
Albumin/Globulin Ratio: 1.8 (ref 1.2–2.2)
Albumin: 4.4 g/dL (ref 3.8–4.9)
Alkaline Phosphatase: 97 IU/L (ref 44–121)
BUN/Creatinine Ratio: 8 — ABNORMAL LOW (ref 10–24)
BUN: 10 mg/dL (ref 8–27)
Bilirubin Total: 0.3 mg/dL (ref 0.0–1.2)
CO2: 27 mmol/L (ref 20–29)
Calcium: 9.2 mg/dL (ref 8.6–10.2)
Chloride: 98 mmol/L (ref 96–106)
Creatinine, Ser: 1.29 mg/dL — ABNORMAL HIGH (ref 0.76–1.27)
Globulin, Total: 2.4 g/dL (ref 1.5–4.5)
Glucose: 132 mg/dL — ABNORMAL HIGH (ref 65–99)
Potassium: 4.5 mmol/L (ref 3.5–5.2)
Sodium: 142 mmol/L (ref 134–144)
Total Protein: 6.8 g/dL (ref 6.0–8.5)
eGFR: 63 mL/min/{1.73_m2} (ref >59)

## 2020-11-01 LAB — CBC WITH DIFFERENTIAL/PLATELET
Basophils Absolute: 0.1 10*3/uL (ref 0.0–0.2)
Basos: 1 %
EOS (ABSOLUTE): 0.2 10*3/uL (ref 0.0–0.4)
Eos: 2 %
Hematocrit: 50.6 % (ref 37.5–51.0)
Hemoglobin: 17.4 g/dL (ref 13.0–17.7)
Immature Grans (Abs): 0.2 10*3/uL — ABNORMAL HIGH (ref 0.0–0.1)
Immature Granulocytes: 2 %
Lymphocytes Absolute: 2.4 10*3/uL (ref 0.7–3.1)
Lymphs: 25 %
MCH: 30.9 pg (ref 26.6–33.0)
MCHC: 34.4 g/dL (ref 31.5–35.7)
MCV: 90 fL (ref 79–97)
Monocytes Absolute: 0.6 10*3/uL (ref 0.1–0.9)
Monocytes: 7 %
Neutrophils Absolute: 6 10*3/uL (ref 1.4–7.0)
Neutrophils: 63 %
Platelets: 172 10*3/uL (ref 150–450)
RBC: 5.64 x10E6/uL (ref 4.14–5.80)
RDW: 11.9 % (ref 11.6–15.4)
WBC: 9.4 10*3/uL (ref 3.4–10.8)

## 2020-11-01 LAB — LIPID PANEL
Chol/HDL Ratio: 3.5 ratio (ref 0.0–5.0)
Cholesterol, Total: 123 mg/dL (ref 100–199)
HDL: 35 mg/dL — ABNORMAL LOW (ref >39)
LDL Chol Calc (NIH): 59 mg/dL (ref 0–99)
Triglycerides: 175 mg/dL — ABNORMAL HIGH (ref 0–149)
VLDL Cholesterol Cal: 29 mg/dL (ref 5–40)

## 2020-11-01 LAB — VITAMIN D 25 HYDROXY (VIT D DEFICIENCY, FRACTURES): Vit D, 25-Hydroxy: 19.4 ng/mL — ABNORMAL LOW (ref 30.0–100.0)

## 2020-11-01 LAB — HEPATITIS C ANTIBODY: Hep C Virus Ab: <0.1 s/co ratio (ref 0.0–0.9)

## 2020-11-01 LAB — HIV ANTIBODY (ROUTINE TESTING W REFLEX): HIV Screen 4th Generation wRfx: NONREACTIVE

## 2020-11-01 LAB — PSA, TOTAL AND FREE
PSA, Free Pct: 34 %
PSA, Free: 0.17 ng/mL
Prostate Specific Ag, Serum: 0.5 ng/mL (ref 0.0–4.0)

## 2020-11-03 ENCOUNTER — Other Ambulatory Visit: Payer: Self-pay | Admitting: Family Medicine

## 2020-11-03 MED ORDER — VITAMIN D (ERGOCALCIFEROL) 1.25 MG (50000 UNIT) PO CAPS: 50000.0000 [IU] | ORAL_CAPSULE | ORAL | 3 refills | Status: DC

## 2020-11-03 NOTE — Progress Notes (Signed)
Dear Edwin Taylor, Your Vitamin D is  low. You need a prescription strength supplement I will send that in for you. Nurse, if at all possible, could you send in a prescription for the patient for vitamin D 50,000 units, 1 p.o. weekly #13 with 3 refills? Many thanks, WS

## 2020-12-13 ENCOUNTER — Other Ambulatory Visit: Payer: Self-pay | Admitting: Family Medicine

## 2020-12-13 DIAGNOSIS — E782 Mixed hyperlipidemia: Secondary | ICD-10-CM

## 2020-12-16 ENCOUNTER — Other Ambulatory Visit: Payer: Self-pay | Admitting: Family Medicine

## 2020-12-16 DIAGNOSIS — E782 Mixed hyperlipidemia: Secondary | ICD-10-CM

## 2020-12-17 ENCOUNTER — Other Ambulatory Visit: Payer: Self-pay | Admitting: Family Medicine

## 2020-12-17 DIAGNOSIS — E782 Mixed hyperlipidemia: Secondary | ICD-10-CM

## 2020-12-17 NOTE — Telephone Encounter (Signed)
NA/VM full. I called [facility], they will get refill ready for patient.  Refill was sent on 10/31/20 #90 with a refill

## 2020-12-21 ENCOUNTER — Other Ambulatory Visit: Payer: Self-pay | Admitting: Family Medicine

## 2020-12-21 DIAGNOSIS — M109 Gout, unspecified: Secondary | ICD-10-CM

## 2020-12-26 ENCOUNTER — Other Ambulatory Visit: Payer: Self-pay | Admitting: Family Medicine

## 2020-12-26 DIAGNOSIS — M109 Gout, unspecified: Secondary | ICD-10-CM

## 2021-01-09 ENCOUNTER — Other Ambulatory Visit: Payer: Self-pay | Admitting: Family Medicine

## 2021-01-09 DIAGNOSIS — I10 Essential (primary) hypertension: Secondary | ICD-10-CM

## 2021-07-07 ENCOUNTER — Encounter: Payer: PRIVATE HEALTH INSURANCE | Admitting: Family Medicine

## 2021-07-07 NOTE — Progress Notes (Signed)
No answer X 4

## 2021-09-27 ENCOUNTER — Other Ambulatory Visit: Payer: Self-pay | Admitting: Family Medicine

## 2021-09-27 DIAGNOSIS — E782 Mixed hyperlipidemia: Secondary | ICD-10-CM

## 2021-10-08 ENCOUNTER — Other Ambulatory Visit: Payer: Self-pay | Admitting: Family Medicine

## 2021-10-08 DIAGNOSIS — M109 Gout, unspecified: Secondary | ICD-10-CM

## 2021-11-05 ENCOUNTER — Other Ambulatory Visit: Payer: Self-pay | Admitting: Family Medicine

## 2021-11-05 DIAGNOSIS — I10 Essential (primary) hypertension: Secondary | ICD-10-CM

## 2021-11-08 ENCOUNTER — Other Ambulatory Visit: Payer: Self-pay | Admitting: Family Medicine

## 2021-11-08 DIAGNOSIS — J449 Chronic obstructive pulmonary disease, unspecified: Secondary | ICD-10-CM

## 2021-11-10 ENCOUNTER — Telehealth: Payer: Self-pay | Admitting: Family Medicine

## 2021-11-10 ENCOUNTER — Other Ambulatory Visit: Payer: Self-pay | Admitting: Family Medicine

## 2021-11-10 DIAGNOSIS — I10 Essential (primary) hypertension: Secondary | ICD-10-CM

## 2021-11-10 DIAGNOSIS — J449 Chronic obstructive pulmonary disease, unspecified: Secondary | ICD-10-CM

## 2021-11-10 MED ORDER — ALBUTEROL SULFATE HFA 108 (90 BASE) MCG/ACT IN AERS: INHALATION_SPRAY | RESPIRATORY_TRACT | 0 refills | Status: DC

## 2021-11-10 MED ORDER — AMLODIPINE BESYLATE 5 MG PO TABS: 5.0000 mg | ORAL_TABLET | Freq: Every day | ORAL | 0 refills | Status: DC

## 2021-11-10 NOTE — Telephone Encounter (Signed)
?  Prescription Request ? ?11/10/2021 ? ?Is this a "Controlled Substance" medicine? no ? ?Have you seen your PCP in the last 2 weeks? no ? ?If YES, route message to pool  -  If NO, patient needs to be scheduled for appointment. ? ?What is the name of the medication or equipment? Amlodipine 5 mg, Albuterol Sulfate ? ?Have you contacted your pharmacy to request a refill? YES  ? ?Which pharmacy would you like this sent to? [facility] ? ? ?Patient notified that their request is being sent to the clinical staff for review and that they should receive a response within 2 business days.  ?  ?

## 2021-11-10 NOTE — Telephone Encounter (Signed)
I sent in a 30 days supply. Please contact the patient letting them know that they will need an appointment before any further prescriptions can be sent in. ?

## 2021-11-10 NOTE — Telephone Encounter (Signed)
Patient has not been seen in over a year. Patient has appointment 5-9. Is it ok to refill?  ?

## 2021-11-10 NOTE — Telephone Encounter (Signed)
Patient aware and verbalized understanding. °

## 2021-11-20 ENCOUNTER — Other Ambulatory Visit: Payer: Self-pay | Admitting: Family Medicine

## 2021-11-20 DIAGNOSIS — E782 Mixed hyperlipidemia: Secondary | ICD-10-CM

## 2021-12-09 ENCOUNTER — Ambulatory Visit: Payer: Managed Care, Other (non HMO) | Admitting: Family Medicine

## 2021-12-09 ENCOUNTER — Encounter: Payer: Self-pay | Admitting: Family Medicine

## 2021-12-09 VITALS — BP 133/71 | HR 60 | Temp 97.9°F | Ht 67.0 in | Wt 220.6 lb

## 2021-12-09 DIAGNOSIS — F17200 Nicotine dependence, unspecified, uncomplicated: Secondary | ICD-10-CM

## 2021-12-09 DIAGNOSIS — I10 Essential (primary) hypertension: Secondary | ICD-10-CM | POA: Diagnosis not present

## 2021-12-09 DIAGNOSIS — M109 Gout, unspecified: Secondary | ICD-10-CM

## 2021-12-09 DIAGNOSIS — J449 Chronic obstructive pulmonary disease, unspecified: Secondary | ICD-10-CM

## 2021-12-09 DIAGNOSIS — E559 Vitamin D deficiency, unspecified: Secondary | ICD-10-CM

## 2021-12-09 DIAGNOSIS — E782 Mixed hyperlipidemia: Secondary | ICD-10-CM

## 2021-12-09 DIAGNOSIS — Z125 Encounter for screening for malignant neoplasm of prostate: Secondary | ICD-10-CM

## 2021-12-09 MED ORDER — ALLOPURINOL 300 MG PO TABS: 300.0000 mg | ORAL_TABLET | Freq: Every day | ORAL | 3 refills | Status: DC

## 2021-12-09 MED ORDER — LISINOPRIL 10 MG PO TABS: 10.0000 mg | ORAL_TABLET | Freq: Every day | ORAL | 3 refills | Status: DC

## 2021-12-09 MED ORDER — UMECLIDINIUM-VILANTEROL 62.5-25 MCG/ACT IN AEPB: 1.0000 | INHALATION_SPRAY | Freq: Every day | RESPIRATORY_TRACT | 11 refills | Status: DC

## 2021-12-09 MED ORDER — ALBUTEROL SULFATE HFA 108 (90 BASE) MCG/ACT IN AERS: INHALATION_SPRAY | RESPIRATORY_TRACT | 11 refills | Status: DC

## 2021-12-09 MED ORDER — AMLODIPINE BESYLATE 5 MG PO TABS: 5.0000 mg | ORAL_TABLET | Freq: Every day | ORAL | 3 refills | Status: DC

## 2021-12-09 MED ORDER — ATORVASTATIN CALCIUM 40 MG PO TABS: 40.0000 mg | ORAL_TABLET | Freq: Every day | ORAL | 3 refills | Status: DC

## 2021-12-09 NOTE — Progress Notes (Signed)
? ?Subjective:  ?Patient ID: Edwin Taylor, male    DOB: 02-16-1961  Age: 61 y.o. MRN: [id number] ? ?CC: Medical Management of Chronic Issues ? ? ?HPI ?Edwin Taylor presents for  follow-up of hypertension. Patient has no history of headache chest pain or shortness of breath or recent cough. Patient also denies symptoms of TIA such as focal numbness or weakness. Patient denies side effects from medication. States taking it regularly. ? ?Breathing is stable. Has to stop for dyspnea. Had pneumonia twice, then had Covid. Then got more shortness of breath. Cut smoking from two ppd to <1 ppd. Started smoking age 61. Up to two packs a day by age 41 = 54 pack year hx.  ? ? ?History ?Edwin Taylor has a past medical history of Acute exacerbation of chronic obstructive pulmonary disease (COPD) ([place]) (07/05/2017), Gout, Hernia, Hyperlipidemia, Hypertension, Obesity, and Tobacco use.  ? ?Edwin Taylor has no past surgical history on file.  ? ?His family history includes Arthritis in his mother; Birth defects in his sister; Cancer (age of onset: 64) in his father; Dementia in his maternal grandmother; Diabetes in his father, maternal grandfather, maternal grandmother, and paternal grandmother; Heart disease in his maternal grandfather and paternal grandmother.Edwin Taylor reports that Edwin Taylor has been smoking cigarettes. Edwin Taylor has been smoking an average of 2 packs per day. Edwin Taylor has never used smokeless tobacco. Edwin Taylor reports current alcohol use of about 18.0 standard drinks per week. Edwin Taylor reports that Edwin Taylor does not use drugs. ? ?Current Outpatient Medications on File Prior to Visit  ?Medication Sig Dispense Refill  ? colchicine 0.6 MG tablet TAKE ONE TABLET BY MOUTH ONCE DAILY 10 tablet 2  ? ?Current Facility-Administered Medications on File Prior to Visit  ?Medication Dose Route Frequency Provider Last Rate Last Admin  ? betamethasone acetate-betamethasone sodium phosphate (CELESTONE) injection 6 mg  6 mg Intramuscular Once Claretta Fraise, MD      ? ? ?ROS ?Review of  Systems  ?Constitutional:  Negative for fever.  ?Respiratory:  Positive for shortness of breath.   ?Cardiovascular:  Negative for chest pain.  ?Musculoskeletal:  Negative for arthralgias.  ?Skin:  Negative for rash.  ? ?Objective:  ?BP 133/71   Pulse 60   Temp 97.9 ?F (36.6 ?C)   Ht '5\' 7"'  (1.702 m)   Wt 220 lb 9.6 oz (100.1 kg)   SpO2 92%   BMI 34.55 kg/m?  ? ?BP Readings from Last 3 Encounters:  ?12/09/21 133/71  ?10/31/20 136/76  ?09/28/18 134/77  ? ? ?Wt Readings from Last 3 Encounters:  ?12/09/21 220 lb 9.6 oz (100.1 kg)  ?10/31/20 216 lb 3.2 oz (98.1 kg)  ?09/28/18 222 lb 4 oz (100.8 kg)  ? ? ? ?Physical Exam ?Vitals reviewed.  ?Constitutional:   ?   Appearance: Edwin Taylor is well-developed.  ?HENT:  ?   Head: Normocephalic and atraumatic.  ?   Right Ear: External ear normal.  ?   Left Ear: External ear normal.  ?   Mouth/Throat:  ?   Pharynx: No oropharyngeal exudate or posterior oropharyngeal erythema.  ?Eyes:  ?   Pupils: Pupils are equal, round, and reactive to light.  ?Cardiovascular:  ?   Rate and Rhythm: Normal rate and regular rhythm.  ?   Heart sounds: No murmur heard. ?Pulmonary:  ?   Effort: No respiratory distress.  ?   Breath sounds: Normal breath sounds.  ?Musculoskeletal:  ?   Cervical back: Normal range of motion and neck supple.  ?Neurological:  ?  Mental Status: Edwin Taylor is alert and oriented to person, place, and time.  ? ? ? ? ?Assessment & Plan:  ? ?Edwin Taylor was seen today for medical management of chronic issues. ? ?Diagnoses and all orders for this visit: ? ?Has been smoking tobacco for 30 years or more ?-     CT CHEST LUNG CANCER SCREENING LOW DOSE WO CONTRAST; Future ? ?Mixed hyperlipidemia ?-     Lipid panel ?-     atorvastatin (LIPITOR) 40 MG tablet; Take 1 tablet (40 mg total) by mouth daily. ? ?Primary hypertension ?-     CBC with Differential/Platelet ?-     CMP14+EGFR ? ?Vitamin D deficiency ?-     VITAMIN D 25 Hydroxy (Vit-D Deficiency, Fractures) ? ?Screening for prostate cancer ?-      PSA, total and free ? ?Gout of multiple sites, unspecified cause, unspecified chronicity ?-     allopurinol (ZYLOPRIM) 300 MG tablet; Take 1 tablet (300 mg total) by mouth daily. ? ?Essential hypertension ?-     amLODipine (NORVASC) 5 MG tablet; Take 1 tablet (5 mg total) by mouth daily. ?-     lisinopril (ZESTRIL) 10 MG tablet; Take 1 tablet (10 mg total) by mouth daily. ? ?COPD mixed type (HCC) ?-     albuterol (VENTOLIN HFA) 108 (90 Base) MCG/ACT inhaler; INHALE 2 PUFFS BY MOUTH EVERY 4 HOURS AS NEEDED FOR WHEEZING AND FOR SHORTNESS OF BREATH ? ?Other orders ?-     umeclidinium-vilanterol (ANORO ELLIPTA) 62.5-25 MCG/ACT AEPB; Inhale 1 puff into the lungs daily at 6 (six) AM. ? ? ?Allergies as of 12/09/2021   ?No Known Allergies ?  ? ?  ?Medication List  ?  ? ?  ? Accurate as of Dec 09, 2021 10:57 AM. If you have any questions, ask your nurse or doctor.  ?  ?  ? ?  ? ?STOP taking these medications   ? ?dexamethasone 6 MG tablet ?Commonly known as: DECADRON ?Stopped by: Claretta Fraise, MD ?  ? ?  ? ?TAKE these medications   ? ?albuterol 108 (90 Base) MCG/ACT inhaler ?Commonly known as: VENTOLIN HFA ?INHALE 2 PUFFS BY MOUTH EVERY 4 HOURS AS NEEDED FOR WHEEZING AND FOR SHORTNESS OF BREATH ?  ?allopurinol 300 MG tablet ?Commonly known as: ZYLOPRIM ?Take 1 tablet (300 mg total) by mouth daily. ?What changed: additional instructions ?Changed by: Claretta Fraise, MD ?  ?amLODipine 5 MG tablet ?Commonly known as: NORVASC ?Take 1 tablet (5 mg total) by mouth daily. ?  ?atorvastatin 40 MG tablet ?Commonly known as: LIPITOR ?Take 1 tablet (40 mg total) by mouth daily. ?  ?colchicine 0.6 MG tablet ?TAKE ONE TABLET BY MOUTH ONCE DAILY ?  ?lisinopril 10 MG tablet ?Commonly known as: ZESTRIL ?Take 1 tablet (10 mg total) by mouth daily. ?  ?umeclidinium-vilanterol 62.5-25 MCG/ACT Aepb ?Commonly known as: ANORO ELLIPTA ?Inhale 1 puff into the lungs daily at 6 (six) AM. ?Started by: Claretta Fraise, MD ?  ? ?  ? ? ?Meds ordered this  encounter  ?Medications  ? allopurinol (ZYLOPRIM) 300 MG tablet  ?  Sig: Take 1 tablet (300 mg total) by mouth daily.  ?  Dispense:  90 tablet  ?  Refill:  3  ? amLODipine (NORVASC) 5 MG tablet  ?  Sig: Take 1 tablet (5 mg total) by mouth daily.  ?  Dispense:  90 tablet  ?  Refill:  3  ? atorvastatin (LIPITOR) 40 MG tablet  ?  Sig: Take  1 tablet (40 mg total) by mouth daily.  ?  Dispense:  90 tablet  ?  Refill:  3  ? lisinopril (ZESTRIL) 10 MG tablet  ?  Sig: Take 1 tablet (10 mg total) by mouth daily.  ?  Dispense:  90 tablet  ?  Refill:  3  ? umeclidinium-vilanterol (ANORO ELLIPTA) 62.5-25 MCG/ACT AEPB  ?  Sig: Inhale 1 puff into the lungs daily at 6 (six) AM.  ?  Dispense:  1 each  ?  Refill:  11  ? albuterol (VENTOLIN HFA) 108 (90 Base) MCG/ACT inhaler  ?  Sig: INHALE 2 PUFFS BY MOUTH EVERY 4 HOURS AS NEEDED FOR WHEEZING AND FOR SHORTNESS OF BREATH  ?  Dispense:  18 g  ?  Refill:  11  ? ? ? ?Follow-up: Return in about 6 months (around 06/11/2022), or if symptoms worsen or fail to improve, for COPD. ? ?Claretta Fraise, M.D. ?

## 2021-12-10 LAB — CMP14+EGFR
ALT: 11 IU/L (ref 0–44)
AST: 15 IU/L (ref 0–40)
Albumin/Globulin Ratio: 2.2 (ref 1.2–2.2)
Albumin: 4.8 g/dL (ref 3.8–4.8)
Alkaline Phosphatase: 102 IU/L (ref 44–121)
BUN/Creatinine Ratio: 8 — ABNORMAL LOW (ref 10–24)
BUN: 11 mg/dL (ref 8–27)
Bilirubin Total: 0.6 mg/dL (ref 0.0–1.2)
CO2: 27 mmol/L (ref 20–29)
Calcium: 9.7 mg/dL (ref 8.6–10.2)
Chloride: 99 mmol/L (ref 96–106)
Creatinine, Ser: 1.32 mg/dL — ABNORMAL HIGH (ref 0.76–1.27)
Globulin, Total: 2.2 g/dL (ref 1.5–4.5)
Glucose: 107 mg/dL — ABNORMAL HIGH (ref 70–99)
Potassium: 4.8 mmol/L (ref 3.5–5.2)
Sodium: 140 mmol/L (ref 134–144)
Total Protein: 7 g/dL (ref 6.0–8.5)
eGFR: 61 mL/min/{1.73_m2} (ref >59)

## 2021-12-10 LAB — CBC WITH DIFFERENTIAL/PLATELET
Basophils Absolute: 0.1 10*3/uL (ref 0.0–0.2)
Basos: 1 %
EOS (ABSOLUTE): 0.2 10*3/uL (ref 0.0–0.4)
Eos: 1 %
Hematocrit: 51.2 % — ABNORMAL HIGH (ref 37.5–51.0)
Hemoglobin: 17.5 g/dL (ref 13.0–17.7)
Immature Grans (Abs): 0.2 10*3/uL — ABNORMAL HIGH (ref 0.0–0.1)
Immature Granulocytes: 2 %
Lymphocytes Absolute: 2.2 10*3/uL (ref 0.7–3.1)
Lymphs: 21 %
MCH: 30.4 pg (ref 26.6–33.0)
MCHC: 34.2 g/dL (ref 31.5–35.7)
MCV: 89 fL (ref 79–97)
Monocytes Absolute: 0.8 10*3/uL (ref 0.1–0.9)
Monocytes: 7 %
Neutrophils Absolute: 7 10*3/uL (ref 1.4–7.0)
Neutrophils: 68 %
Platelets: 166 10*3/uL (ref 150–450)
RBC: 5.75 x10E6/uL (ref 4.14–5.80)
RDW: 11.9 % (ref 11.6–15.4)
WBC: 10.4 10*3/uL (ref 3.4–10.8)

## 2021-12-10 LAB — LIPID PANEL
Chol/HDL Ratio: 3.3 ratio (ref 0.0–5.0)
Cholesterol, Total: 115 mg/dL (ref 100–199)
HDL: 35 mg/dL — ABNORMAL LOW (ref >39)
LDL Chol Calc (NIH): 58 mg/dL (ref 0–99)
Triglycerides: 121 mg/dL (ref 0–149)
VLDL Cholesterol Cal: 22 mg/dL (ref 5–40)

## 2021-12-10 LAB — PSA, TOTAL AND FREE
PSA, Free Pct: 33.3 %
PSA, Free: 0.2 ng/mL
Prostate Specific Ag, Serum: 0.6 ng/mL (ref 0.0–4.0)

## 2021-12-10 LAB — VITAMIN D 25 HYDROXY (VIT D DEFICIENCY, FRACTURES): Vit D, 25-Hydroxy: 51.3 ng/mL (ref 30.0–100.0)

## 2021-12-10 NOTE — Progress Notes (Signed)
Hello Marky,  Your lab result is normal and/or stable.Some minor variations that are not significant are commonly marked abnormal, but do not represent any medical problem for you.  Best regards, Sarena Jezek, M.D.

## 2021-12-30 ENCOUNTER — Telehealth: Payer: Self-pay | Admitting: Family Medicine

## 2021-12-30 ENCOUNTER — Other Ambulatory Visit: Payer: Self-pay | Admitting: Family Medicine

## 2021-12-30 MED ORDER — FLUTICASONE FUROATE-VILANTEROL 100-25 MCG/ACT IN AEPB: 1.0000 | INHALATION_SPRAY | Freq: Every day | RESPIRATORY_TRACT | 5 refills | Status: DC

## 2021-12-30 NOTE — Telephone Encounter (Signed)
The partial information I have is that the replacement inhaler should be preferred level 1.  Let me know if that does not work out meanwhile I sent it to your pharmacy.

## 2021-12-30 NOTE — Telephone Encounter (Signed)
Left vm for pt to call the office back

## 2022-02-15 ENCOUNTER — Other Ambulatory Visit: Payer: Self-pay | Admitting: Family Medicine

## 2022-02-16 NOTE — Telephone Encounter (Signed)
Last OV 12/09/21. Last RF 11/03/21. Next OV 06/15/22. Vit D level last checked 12/09/21 - 51.3

## 2022-03-20 ENCOUNTER — Other Ambulatory Visit: Payer: Self-pay | Admitting: Family Medicine

## 2022-03-20 DIAGNOSIS — I10 Essential (primary) hypertension: Secondary | ICD-10-CM

## 2022-05-15 ENCOUNTER — Other Ambulatory Visit: Payer: Self-pay | Admitting: Family Medicine

## 2022-06-15 ENCOUNTER — Encounter: Payer: Self-pay | Admitting: Family Medicine

## 2022-06-15 ENCOUNTER — Ambulatory Visit: Payer: Managed Care, Other (non HMO) | Admitting: Family Medicine

## 2022-06-15 VITALS — BP 139/69 | HR 62 | Temp 98.1°F | Ht 67.0 in | Wt 221.4 lb

## 2022-06-15 DIAGNOSIS — E782 Mixed hyperlipidemia: Secondary | ICD-10-CM | POA: Diagnosis not present

## 2022-06-15 DIAGNOSIS — I10 Essential (primary) hypertension: Secondary | ICD-10-CM

## 2022-06-15 DIAGNOSIS — E559 Vitamin D deficiency, unspecified: Secondary | ICD-10-CM | POA: Diagnosis not present

## 2022-06-15 DIAGNOSIS — R7303 Prediabetes: Secondary | ICD-10-CM | POA: Diagnosis not present

## 2022-06-15 LAB — BAYER DCA HB A1C WAIVED: HB A1C (BAYER DCA - WAIVED): 6 % — ABNORMAL HIGH (ref 4.8–5.6)

## 2022-06-15 MED ORDER — BUDESONIDE-FORMOTEROL FUMARATE 160-4.5 MCG/ACT IN AERO: 2.0000 | INHALATION_SPRAY | Freq: Two times a day (BID) | RESPIRATORY_TRACT | 5 refills | Status: DC

## 2022-06-15 MED ORDER — LISINOPRIL 10 MG PO TABS: 10.0000 mg | ORAL_TABLET | Freq: Every day | ORAL | 3 refills | Status: DC

## 2022-06-15 NOTE — Progress Notes (Signed)
Subjective:  Patient ID: Edwin Taylor, male    DOB: [birth year]  Age: 61 y.o. MRN: [id number]  CC: Medical Management of Chronic Issues   HPI Edwin Taylor presents for  presents for  follow-up of hypertension. Patient has no history of headache chest pain or shortness of breath or recent cough. Patient also denies symptoms of TIA such as focal numbness or weakness. Patient denies side effects from medication. Off med intermittently. Forgets to take it.    in for follow-up of elevated cholesterol. Doing well without complaints on current medication. Denies side effects of statin including myalgia and arthralgia and nausea. Currently no chest pain, shortness of breath or other cardiovascular related symptoms noted. Forgets to take it as well.   Taking vitamin D on Wednesday nights. Remembers it.   Breathing okay unless he has to tote something or walk a long way.  Walking causes him to have to urinate. May not pee a thimble full. Asked about smoking cessation, "That ain't happening, " is his response.      .     06/15/2022    7:53 AM 12/09/2021   10:20 AM 12/09/2021   10:08 AM  Depression screen PHQ 2/9  Decreased Interest 0 0 0  Down, Depressed, Hopeless 0 0 0  PHQ - 2 Score 0 0 0  Altered sleeping  0   Tired, decreased energy  3   Change in appetite  0   Feeling bad or failure about yourself   0   Trouble concentrating  0   Moving slowly or fidgety/restless  0   Suicidal thoughts  0   PHQ-9 Score  3   Difficult doing work/chores  Somewhat difficult     History Edwin Taylor has a past medical history of Acute exacerbation of chronic obstructive pulmonary disease (COPD) ([place]) (07/05/2017), Gout, Hernia, Hyperlipidemia, Hypertension, Obesity, and Tobacco use.   He has no past surgical history on file.   His family history includes Arthritis in his mother; Birth defects in his sister; Cancer (age of onset: 57) in his father; Dementia in his maternal grandmother; Diabetes in  his father, maternal grandfather, maternal grandmother, and paternal grandmother; Heart disease in his maternal grandfather and paternal grandmother.He reports that he has been smoking cigarettes. He has been smoking an average of 2 packs per day. He has never used smokeless tobacco. He reports current alcohol use of about 18.0 standard drinks of alcohol per week. He reports that he does not use drugs.    ROS Review of Systems  Constitutional:  Negative for fever.  Respiratory:  Negative for shortness of breath.   Cardiovascular:  Negative for chest pain.  Genitourinary:  Positive for frequency (daytime).  Musculoskeletal:  Positive for arthralgias (R 2nd MCP pain with driving.).  Skin:  Negative for rash.    Objective:  BP 139/69   Pulse 62   Temp 98.1 F (36.7 C)   Ht _0  (1.702 m)   Wt 221 lb 6.4 oz (100.4 kg)   SpO2 92%   BMI 34.68 kg/m   BP Readings from Last 3 Encounters:  06/15/22 139/69  12/09/21 133/71  10/31/20 136/76    Wt Readings from Last 3 Encounters:  06/15/22 221 lb 6.4 oz (100.4 kg)  12/09/21 220 lb 9.6 oz (100.1 kg)  10/31/20 216 lb 3.2 oz (98.1 kg)     Physical Exam Vitals reviewed.  Constitutional:      Appearance: He is well-developed.  HENT:  Head: Normocephalic and atraumatic.     Right Ear: External ear normal.     Left Ear: External ear normal.     Mouth/Throat:     Pharynx: No oropharyngeal exudate or posterior oropharyngeal erythema.  Eyes:     Pupils: Pupils are equal, round, and reactive to light.  Cardiovascular:     Rate and Rhythm: Normal rate and regular rhythm.     Heart sounds: No murmur heard. Pulmonary:     Effort: No respiratory distress.     Breath sounds: Wheezing present.  Musculoskeletal:     Cervical back: Normal range of motion and neck supple.  Neurological:     Mental Status: He is alert and oriented to person, place, and time.       Assessment & Plan:   Edwin Taylor was seen today for medical management  of chronic issues.  Diagnoses and all orders for this visit:  Primary hypertension -     CBC with Differential/Platelet -     CMP14+EGFR  Mixed hyperlipidemia -     Lipid panel  Vitamin D deficiency -     VITAMIN D 25 Hydroxy (Vit-D Deficiency, Fractures)  Pre-diabetes -     Bayer DCA Hb A1c Waived  Essential hypertension -     lisinopril (ZESTRIL) 10 MG tablet; Take 1 tablet (10 mg total) by mouth daily.  Other orders -     budesonide-formoterol (SYMBICORT) 160-4.5 MCG/ACT inhaler; Inhale 2 puffs into the lungs 2 (two) times daily.       I have changed Edwin Taylor's lisinopril. I am also having him start on budesonide-formoterol. Additionally, I am having him maintain his colchicine, allopurinol, amLODipine, atorvastatin, albuterol, fluticasone furoate-vilanterol, and Vitamin D (Ergocalciferol). We will continue to administer betamethasone acetate-betamethasone sodium phosphate.  Allergies as of 06/15/2022   No Known Allergies      Medication List        Accurate as of June 15, 2022  8:38 AM. If you have any questions, ask your nurse or doctor.          albuterol 108 (90 Base) MCG/ACT inhaler Commonly known as: VENTOLIN HFA INHALE 2 PUFFS BY MOUTH EVERY 4 HOURS AS NEEDED FOR WHEEZING AND FOR SHORTNESS OF BREATH   allopurinol 300 MG tablet Commonly known as: ZYLOPRIM Take 1 tablet (300 mg total) by mouth daily.   amLODipine 5 MG tablet Commonly known as: NORVASC Take 1 tablet (5 mg total) by mouth daily.   atorvastatin 40 MG tablet Commonly known as: LIPITOR Take 1 tablet (40 mg total) by mouth daily.   budesonide-formoterol 160-4.5 MCG/ACT inhaler Commonly known as: SYMBICORT Inhale 2 puffs into the lungs 2 (two) times daily. Started by: Edwin Fraise, MD   colchicine 0.6 MG tablet TAKE ONE TABLET BY MOUTH ONCE DAILY   fluticasone furoate-vilanterol 100-25 MCG/ACT Aepb Commonly known as: Breo Ellipta Inhale 1 puff into the lungs daily.    lisinopril 10 MG tablet Commonly known as: ZESTRIL Take 1 tablet (10 mg total) by mouth daily.   Vitamin D (Ergocalciferol) 1.25 MG (50000 UNIT) Caps capsule Commonly known as: DRISDOL Take 1 capsule by mouth once a week         Follow-up: No follow-ups on file.  Edwin Taylor, M.D.

## 2022-06-16 ENCOUNTER — Telehealth: Payer: Self-pay | Admitting: Family Medicine

## 2022-06-16 LAB — CMP14+EGFR
ALT: 11 IU/L (ref 0–44)
AST: 11 IU/L (ref 0–40)
Albumin/Globulin Ratio: 1.9 (ref 1.2–2.2)
Albumin: 4.2 g/dL (ref 3.9–4.9)
Alkaline Phosphatase: 95 IU/L (ref 44–121)
BUN/Creatinine Ratio: 9 — ABNORMAL LOW (ref 10–24)
BUN: 13 mg/dL (ref 8–27)
Bilirubin Total: 0.3 mg/dL (ref 0.0–1.2)
CO2: 28 mmol/L (ref 20–29)
Calcium: 8.9 mg/dL (ref 8.6–10.2)
Chloride: 100 mmol/L (ref 96–106)
Creatinine, Ser: 1.43 mg/dL — ABNORMAL HIGH (ref 0.76–1.27)
Globulin, Total: 2.2 g/dL (ref 1.5–4.5)
Glucose: 128 mg/dL — ABNORMAL HIGH (ref 70–99)
Potassium: 4.7 mmol/L (ref 3.5–5.2)
Sodium: 142 mmol/L (ref 134–144)
Total Protein: 6.4 g/dL (ref 6.0–8.5)
eGFR: 55 mL/min/{1.73_m2} — ABNORMAL LOW (ref >59)

## 2022-06-16 LAB — CBC WITH DIFFERENTIAL/PLATELET
Basophils Absolute: 0.1 10*3/uL (ref 0.0–0.2)
Basos: 1 %
EOS (ABSOLUTE): 0.1 10*3/uL (ref 0.0–0.4)
Eos: 1 %
Hematocrit: 53.5 % — ABNORMAL HIGH (ref 37.5–51.0)
Hemoglobin: 17.6 g/dL (ref 13.0–17.7)
Immature Grans (Abs): 0.2 10*3/uL — ABNORMAL HIGH (ref 0.0–0.1)
Immature Granulocytes: 2 %
Lymphocytes Absolute: 1.9 10*3/uL (ref 0.7–3.1)
Lymphs: 19 %
MCH: 30 pg (ref 26.6–33.0)
MCHC: 32.9 g/dL (ref 31.5–35.7)
MCV: 91 fL (ref 79–97)
Monocytes Absolute: 0.8 10*3/uL (ref 0.1–0.9)
Monocytes: 8 %
Neutrophils Absolute: 6.9 10*3/uL (ref 1.4–7.0)
Neutrophils: 69 %
Platelets: 158 10*3/uL (ref 150–450)
RBC: 5.86 x10E6/uL — ABNORMAL HIGH (ref 4.14–5.80)
RDW: 11.9 % (ref 11.6–15.4)
WBC: 9.9 10*3/uL (ref 3.4–10.8)

## 2022-06-16 LAB — LIPID PANEL
Chol/HDL Ratio: 2.9 ratio (ref 0.0–5.0)
Cholesterol, Total: 115 mg/dL (ref 100–199)
HDL: 39 mg/dL — ABNORMAL LOW (ref >39)
LDL Chol Calc (NIH): 57 mg/dL (ref 0–99)
Triglycerides: 99 mg/dL (ref 0–149)
VLDL Cholesterol Cal: 19 mg/dL (ref 5–40)

## 2022-06-16 LAB — VITAMIN D 25 HYDROXY (VIT D DEFICIENCY, FRACTURES): Vit D, 25-Hydroxy: 48.8 ng/mL (ref 30.0–100.0)

## 2022-06-16 NOTE — Telephone Encounter (Signed)
Please give Trelegy

## 2022-06-16 NOTE — Telephone Encounter (Signed)
We do not have any samples at this time.  

## 2022-06-16 NOTE — Telephone Encounter (Signed)
Spoke with patient does not want samples wants to wait on stacks to see if he will change it to something else.

## 2022-06-16 NOTE — Telephone Encounter (Signed)
Please give him Brezti sample and an appointment with Raynelle Fanning for medication assistance

## 2022-06-17 ENCOUNTER — Other Ambulatory Visit: Payer: Self-pay | Admitting: Family Medicine

## 2022-06-17 NOTE — Progress Notes (Signed)
Patient returning call. Please call back

## 2022-06-17 NOTE — Telephone Encounter (Signed)
I sent in symbicort. I don't know of any that are cheaper.

## 2022-06-18 NOTE — Telephone Encounter (Signed)
CALLED PATIENT, NOT AVAILABLE 

## 2022-06-18 NOTE — Telephone Encounter (Signed)
PATIENT REPORTS SYMBICORT IS THE $325 INHALER, CANNOT AFFORD. PLEASE SEND CHEAPER MED

## 2022-06-18 NOTE — Telephone Encounter (Signed)
This is an insurane problem. I can not locate one that will be cheaper for him. He might want to check with his insurance company.

## 2022-06-22 NOTE — Telephone Encounter (Signed)
PATIENT WAS GIVEN TREGELY SAMPLES IN OFFICE

## 2022-06-30 ENCOUNTER — Encounter: Payer: Self-pay | Admitting: Nurse Practitioner

## 2022-06-30 ENCOUNTER — Ambulatory Visit: Payer: Managed Care, Other (non HMO) | Admitting: Nurse Practitioner

## 2022-06-30 VITALS — BP 133/78 | HR 77 | Temp 98.7°F | Ht 67.0 in | Wt 221.6 lb

## 2022-06-30 DIAGNOSIS — J209 Acute bronchitis, unspecified: Secondary | ICD-10-CM | POA: Diagnosis not present

## 2022-06-30 MED ORDER — AZITHROMYCIN 250 MG PO TABS: ORAL_TABLET | ORAL | 0 refills | Status: AC

## 2022-06-30 MED ORDER — FLUTICASONE PROPIONATE 50 MCG/ACT NA SUSP: 2.0000 | Freq: Every day | NASAL | 6 refills | Status: DC

## 2022-06-30 MED ORDER — BUDESONIDE-FORMOTEROL FUMARATE 160-4.5 MCG/ACT IN AERO: 2.0000 | INHALATION_SPRAY | Freq: Two times a day (BID) | RESPIRATORY_TRACT | 5 refills | Status: DC

## 2022-06-30 NOTE — Patient Instructions (Signed)

## 2022-06-30 NOTE — Progress Notes (Signed)
Acute Office Visit  Subjective:     Patient ID: Edwin Taylor, male    DOB: Apr 03, 1961, 61 y.o.   MRN: [id number]  Chief Complaint  Patient presents with   Cough    States he is coughing up clear stuff    Nasal Congestion    Pt states this has been going on for 2 week    Cough This is a recurrent problem. The current episode started in the past 7 days. The problem has been gradually worsening. The problem occurs constantly. The cough is Productive of sputum. Associated symptoms include nasal congestion and shortness of breath. Pertinent negatives include no chest pain, chills, ear congestion, postnasal drip, rash or sore throat. Nothing aggravates the symptoms. He has tried OTC cough suppressant for the symptoms.    Review of Systems  Constitutional: Negative.  Negative for chills.  HENT:  Positive for congestion. Negative for postnasal drip, sinus pain and sore throat.   Respiratory:  Positive for cough and shortness of breath.   Cardiovascular:  Negative for chest pain.  Skin: Negative.  Negative for itching and rash.  All other systems reviewed and are negative.       Objective:    BP 133/78   Pulse 77   Temp 98.7 F (37.1 C)   Ht 5\' 7"  (1.702 m)   Wt 221 lb 9.6 oz (100.5 kg)   SpO2 (!) 89%   BMI 34.71 kg/m  BP Readings from Last 3 Encounters:  06/30/22 133/78  06/15/22 139/69  12/09/21 133/71   Wt Readings from Last 3 Encounters:  06/30/22 221 lb 9.6 oz (100.5 kg)  06/15/22 221 lb 6.4 oz (100.4 kg)  12/09/21 220 lb 9.6 oz (100.1 kg)      Physical Exam Vitals and nursing note reviewed.  Constitutional:      Appearance: Normal appearance.  HENT:     Head: Normocephalic.     Right Ear: External ear normal.     Left Ear: External ear normal.     Nose: Congestion present.     Mouth/Throat:     Mouth: Mucous membranes are moist.     Pharynx: Oropharynx is clear.  Eyes:     Conjunctiva/sclera: Conjunctivae normal.  Cardiovascular:     Rate and  Rhythm: Normal rate and regular rhythm.     Pulses: Normal pulses.     Heart sounds: Normal heart sounds.  Pulmonary:     Effort: Pulmonary effort is normal.     Breath sounds: Normal breath sounds.  Abdominal:     General: Bowel sounds are normal.  Skin:    General: Skin is warm.     Findings: No erythema or rash.  Neurological:     Mental Status: He is alert and oriented to person, place, and time.    Smoking cessation instruction/counseling given:  counseled patient on the dangers of tobacco use, advised patient to stop smoking, and reviewed strategies to maximize success    No results found for any visits on 06/30/22.      Assessment & Plan:  Patient presents with URI symptoms with cough and congestion, symptoms presents as acute bronchitis.. Educated patient with hand out given to Take meds as prescribed - Use a cool mist humidifier  -Use saline nose sprays frequently -Force fluids -For fever or aches or pains- take Tylenol or ibuprofen. -If symptoms do not improve, she may need to be COVID tested to rule this out Follow up with worsening unresolved symptoms  Problem List Items Addressed This Visit   None Visit Diagnoses     Acute bronchitis, unspecified organism    -  Primary   Relevant Medications   azithromycin (ZITHROMAX) 250 MG tablet   fluticasone (FLONASE) 50 MCG/ACT nasal spray       Meds ordered this encounter  Medications   azithromycin (ZITHROMAX) 250 MG tablet    Sig: Take 2 tablets on day 1, then 1 tablet daily on days 2 through 5    Dispense:  6 tablet    Refill:  0    Order Specific Question:   Supervising Provider    Answer:   Mechele Claude [[id number]]   fluticasone (FLONASE) 50 MCG/ACT nasal spray    Sig: Place 2 sprays into both nostrils daily.    Dispense:  16 g    Refill:  6    Order Specific Question:   Supervising Provider    Answer:   Standley Brooking   budesonide-formoterol (SYMBICORT) 160-4.5 MCG/ACT inhaler    Sig: Inhale 2  puffs into the lungs 2 (two) times daily.    Dispense:  1 each    Refill:  5    Order Specific Question:   Supervising Provider    Answer:   Mechele Claude [phone]    Return if symptoms worsen or fail to improve.  Daryll Drown, NP

## 2022-07-03 ENCOUNTER — Telehealth: Payer: Self-pay | Admitting: Family Medicine

## 2022-07-03 NOTE — Telephone Encounter (Signed)
Letter written and printed and placed up front and pt is aware.

## 2022-07-03 NOTE — Telephone Encounter (Signed)
Ok for note 

## 2022-10-07 ENCOUNTER — Telehealth: Payer: Self-pay | Admitting: Family Medicine

## 2022-10-07 NOTE — Telephone Encounter (Signed)
Tell him we do not have samples, but I can switch him to symbicort and spiriva. Dosing is different, but the medications are the same

## 2022-10-07 NOTE — Telephone Encounter (Signed)
Dr Livia Snellen, I do not see Trelegy on patient's current or historical med list, we are showing he is on Symbicort.  Do you know anything about him being on Trelegy?

## 2022-10-07 NOTE — Telephone Encounter (Signed)
Wants to know if we have samples of Trelegy to give patient because he can't afford the medicine, even with insurance.

## 2022-10-08 ENCOUNTER — Other Ambulatory Visit: Payer: Self-pay | Admitting: Family Medicine

## 2022-10-08 MED ORDER — TIOTROPIUM BROMIDE MONOHYDRATE 18 MCG IN CAPS: 18.0000 ug | ORAL_CAPSULE | Freq: Every day | RESPIRATORY_TRACT | 12 refills | Status: DC

## 2022-10-08 MED ORDER — BUDESONIDE-FORMOTEROL FUMARATE 160-4.5 MCG/ACT IN AERO: 2.0000 | INHALATION_SPRAY | Freq: Two times a day (BID) | RESPIRATORY_TRACT | 5 refills | Status: DC

## 2022-10-08 NOTE — Telephone Encounter (Signed)
Please let the patient know that I sent their prescription to their pharmacy. Thanks, WS 

## 2022-10-08 NOTE — Telephone Encounter (Signed)
Patient can't afford the Trelegy please send alternative to [facility]

## 2022-10-09 ENCOUNTER — Telehealth: Payer: Self-pay | Admitting: Family Medicine

## 2022-10-12 ENCOUNTER — Other Ambulatory Visit: Payer: Self-pay | Admitting: Family Medicine

## 2022-10-12 MED ORDER — TRELEGY ELLIPTA 100-62.5-25 MCG/ACT IN AEPB: 1.0000 | INHALATION_SPRAY | Freq: Every day | RESPIRATORY_TRACT | 11 refills | Status: DC

## 2022-10-12 NOTE — Telephone Encounter (Signed)
Refill sent as requested. 

## 2022-10-12 NOTE — Telephone Encounter (Signed)
Patient would like for this to be sent in to the [facility] in [place] instead of [place]. Please resend

## 2022-10-12 NOTE — Telephone Encounter (Signed)
Yes, inhalers have become outrageously expensive. I do not have a way to make them cheaper if insurance will not. I am truly sorry. I ent in the trelegy she requested.

## 2022-10-12 NOTE — Addendum Note (Signed)
Addended by: Milas Hock on: 10/12/2022 03:25 PM   Modules accepted: Orders

## 2022-10-12 NOTE — Telephone Encounter (Signed)
Pt aware Trelegy was sent in.

## 2022-10-14 ENCOUNTER — Other Ambulatory Visit: Payer: Self-pay | Admitting: *Deleted

## 2022-10-14 MED ORDER — TRELEGY ELLIPTA 100-62.5-25 MCG/ACT IN AEPB: 1.0000 | INHALATION_SPRAY | Freq: Every day | RESPIRATORY_TRACT | 11 refills | Status: DC

## 2022-10-14 NOTE — Telephone Encounter (Signed)
P[facility] says that [facility] still [facility].  [facility]. She confirmed that they have not received it. I called it in verbally so they have it now.

## 2022-10-14 NOTE — Telephone Encounter (Signed)
Sent as requested.

## 2022-10-14 NOTE — Telephone Encounter (Signed)
[facility]--Fluticasone-Umeclidin-Vilant (TRELEGY ELLIPTA) 100-62.5-25 MCG/ACT AEPB

## 2022-10-14 NOTE — Telephone Encounter (Signed)
[facility] was [facility] to [facility] as initial message requested.

## 2022-12-16 ENCOUNTER — Other Ambulatory Visit: Payer: Self-pay | Admitting: Family Medicine

## 2022-12-16 DIAGNOSIS — I10 Essential (primary) hypertension: Secondary | ICD-10-CM

## 2022-12-22 ENCOUNTER — Other Ambulatory Visit: Payer: Self-pay | Admitting: Family Medicine

## 2022-12-22 DIAGNOSIS — I10 Essential (primary) hypertension: Secondary | ICD-10-CM

## 2022-12-25 ENCOUNTER — Other Ambulatory Visit: Payer: Self-pay | Admitting: Family Medicine

## 2022-12-25 DIAGNOSIS — I10 Essential (primary) hypertension: Secondary | ICD-10-CM

## 2022-12-29 ENCOUNTER — Other Ambulatory Visit: Payer: Self-pay | Admitting: Family Medicine

## 2022-12-29 DIAGNOSIS — I10 Essential (primary) hypertension: Secondary | ICD-10-CM

## 2022-12-29 DIAGNOSIS — J449 Chronic obstructive pulmonary disease, unspecified: Secondary | ICD-10-CM

## 2022-12-30 ENCOUNTER — Other Ambulatory Visit: Payer: Self-pay | Admitting: Family Medicine

## 2022-12-30 DIAGNOSIS — E782 Mixed hyperlipidemia: Secondary | ICD-10-CM

## 2022-12-30 DIAGNOSIS — M109 Gout, unspecified: Secondary | ICD-10-CM

## 2023-01-21 ENCOUNTER — Encounter: Payer: Self-pay | Admitting: Nurse Practitioner

## 2023-01-21 ENCOUNTER — Ambulatory Visit: Payer: Managed Care, Other (non HMO) | Admitting: Nurse Practitioner

## 2023-01-21 VITALS — BP 127/77 | HR 74 | Temp 97.7°F | Ht 67.0 in | Wt 226.6 lb

## 2023-01-21 DIAGNOSIS — R0989 Other specified symptoms and signs involving the circulatory and respiratory systems: Secondary | ICD-10-CM | POA: Diagnosis not present

## 2023-01-21 DIAGNOSIS — R051 Acute cough: Secondary | ICD-10-CM | POA: Diagnosis not present

## 2023-01-21 DIAGNOSIS — J209 Acute bronchitis, unspecified: Secondary | ICD-10-CM | POA: Diagnosis not present

## 2023-01-21 LAB — VERITOR FLU A/B WAIVED
Influenza A: NEGATIVE
Influenza B: NEGATIVE

## 2023-01-21 MED ORDER — METHYLPREDNISOLONE 4 MG PO TBPK
ORAL_TABLET | ORAL | 0 refills | Status: DC
Start: 2023-01-21 — End: 2023-03-22

## 2023-01-21 MED ORDER — AZITHROMYCIN 250 MG PO TABS
ORAL_TABLET | ORAL | 0 refills | Status: DC
Start: 2023-01-21 — End: 2023-03-22

## 2023-01-21 MED ORDER — ALBUTEROL SULFATE (2.5 MG/3ML) 0.083% IN NEBU
2.5000 mg | INHALATION_SOLUTION | Freq: Once | RESPIRATORY_TRACT | Status: AC
Start: 2023-01-21 — End: 2023-01-21
  Administered 2023-01-21: 2.5 mg via RESPIRATORY_TRACT

## 2023-01-21 MED ORDER — FLUTICASONE PROPIONATE 50 MCG/ACT NA SUSP
2.0000 | Freq: Every day | NASAL | 6 refills | Status: DC
Start: 2023-01-21 — End: 2023-03-22

## 2023-01-21 NOTE — Progress Notes (Signed)
Acute Office Visit  Subjective:     Patient ID: Edwin Taylor, male    DOB: September 13, 1960, 62 y.o.   MRN: [id number]  Chief Complaint  Patient presents with   Nasal Congestion    Started sunday   Cough    Started Sunday   Chills    Started Sunday    HPI SUBJECTIVE:  Edwin Taylor is a 62 y.o. male who complains of congestion, sneezing, dry cough, fever, and chills for 4 days. He denies a history of chest pain, chills, fevers, myalgias, wheezing, and cough and has a history of COPD. Patient has smoke 1-pack daily. POC Flu negative, awaiting for COVID results       ROS Negative unless indicated in HPI    Objective:    BP 127/77   Pulse 74   Temp 97.7 F (36.5 C) (Temporal)   Ht 5\' 7"  (1.702 m)   Wt 226 lb 9.6 oz (102.8 kg)   SpO2 95%   BMI 35.49 kg/m  BP Readings from Last 3 Encounters:  01/21/23 127/77  06/30/22 133/78  06/15/22 139/69   Wt Readings from Last 3 Encounters:  01/21/23 226 lb 9.6 oz (102.8 kg)  06/30/22 221 lb 9.6 oz (100.5 kg)  06/15/22 221 lb 6.4 oz (100.4 kg)      Physical Exam Vitals and nursing note reviewed.  Constitutional:      Appearance: Normal appearance. He is overweight.  HENT:     Head: Normocephalic and atraumatic.     Nose: Congestion and rhinorrhea present. Rhinorrhea is purulent.     Right Sinus: Frontal sinus tenderness present.     Left Sinus: Frontal sinus tenderness present.     Mouth/Throat:     Lips: Pink.     Pharynx: Oropharynx is clear. Uvula midline.  Eyes:     Extraocular Movements: Extraocular movements intact.     Conjunctiva/sclera: Conjunctivae normal.     Pupils: Pupils are equal, round, and reactive to light.  Cardiovascular:     Rate and Rhythm: Normal rate.     Heart sounds: Normal heart sounds.  Pulmonary:     Breath sounds: Examination of the right-lower field reveals wheezing. Examination of the left-lower field reveals wheezing. Wheezing present.  Abdominal:     General: Bowel sounds  are normal. There is distension.     Palpations: Abdomen is soft.  Musculoskeletal:        General: Normal range of motion.     Cervical back: Normal range of motion and neck supple.     Right lower leg: No edema.     Left lower leg: No edema.  Skin:    General: Skin is warm and dry.     Findings: Rash present.  Neurological:     General: No focal deficit present.     Mental Status: He is alert and oriented to person, place, and time. Mental status is at baseline.  Psychiatric:        Mood and Affect: Mood normal.        Thought Content: Thought content normal.     No results found for any visits on 01/21/23.      Assessment & Plan:  Acute cough -     Veritor Flu A/B Waived -     Novel Coronavirus, NAA ([facility]) -     Albuterol Sulfate -     Fluticasone Propionate; Place 2 sprays into both nostrils daily.  Dispense: 16 g; Refill: 6  Runny  nose -     Veritor Flu A/B Waived -     Novel Coronavirus, NAA ([facility]) -     Fluticasone Propionate; Place 2 sprays into both nostrils daily.  Dispense: 16 g; Refill: 6  Acute bronchitis, unspecified organism -     methylPREDNISolone; Follow instruction son the box  Dispense: 21 tablet; Refill: 0 -     Azithromycin; 2 tabs the first day and 1-tab until done  Dispense: 6 each; Refill: 0   ASSESSMENT:  viral upper respiratory illness, bronchiolitis, and bronchospasm  PLAN: Nebulizer treatment at the office Medrol dose pack 21 tabs dispense (follow instructions on the box) - Z-pack 2 tabs then 1-tab until #6 dispense - Continue Flonase Sample of Trilogy provided  Work Note   push fluids, rest, encouraged very strongly to quit smoking, return office visit prn if symptoms persist or worsen  Return if symptoms worsen or fail to improve.  [address] Santa Lighter DNP

## 2023-01-22 LAB — NOVEL CORONAVIRUS, NAA: SARS-CoV-2, NAA: DETECTED — AB

## 2023-01-26 ENCOUNTER — Other Ambulatory Visit: Payer: Self-pay | Admitting: Family Medicine

## 2023-01-26 DIAGNOSIS — J449 Chronic obstructive pulmonary disease, unspecified: Secondary | ICD-10-CM

## 2023-02-09 ENCOUNTER — Other Ambulatory Visit: Payer: Self-pay | Admitting: Family Medicine

## 2023-02-09 DIAGNOSIS — J449 Chronic obstructive pulmonary disease, unspecified: Secondary | ICD-10-CM

## 2023-03-13 ENCOUNTER — Other Ambulatory Visit: Payer: Self-pay | Admitting: Family Medicine

## 2023-03-13 DIAGNOSIS — I10 Essential (primary) hypertension: Secondary | ICD-10-CM

## 2023-03-13 DIAGNOSIS — J449 Chronic obstructive pulmonary disease, unspecified: Secondary | ICD-10-CM

## 2023-03-13 DIAGNOSIS — E782 Mixed hyperlipidemia: Secondary | ICD-10-CM

## 2023-03-15 MED ORDER — ATORVASTATIN CALCIUM 40 MG PO TABS
40.0000 mg | ORAL_TABLET | Freq: Every day | ORAL | 0 refills | Status: DC
Start: 2023-03-15 — End: 2023-03-22

## 2023-03-15 MED ORDER — ALBUTEROL SULFATE HFA 108 (90 BASE) MCG/ACT IN AERS
INHALATION_SPRAY | RESPIRATORY_TRACT | 0 refills | Status: DC
Start: 2023-03-15 — End: 2023-04-06

## 2023-03-15 MED ORDER — AMLODIPINE BESYLATE 5 MG PO TABS
5.0000 mg | ORAL_TABLET | Freq: Every day | ORAL | 0 refills | Status: DC
Start: 2023-03-15 — End: 2023-03-22

## 2023-03-15 NOTE — Telephone Encounter (Signed)
Stacks NTBS 30 days given 12/30/22

## 2023-03-15 NOTE — Addendum Note (Signed)
Addended by: Julious Payer D on: 03/15/2023 03:43 PM   Modules accepted: Orders

## 2023-03-15 NOTE — Telephone Encounter (Signed)
Appt scheduled for 03/22/23 pt does not have enough meds until then please send refill to pharm

## 2023-03-22 ENCOUNTER — Encounter: Payer: Self-pay | Admitting: Family Medicine

## 2023-03-22 ENCOUNTER — Ambulatory Visit: Payer: Managed Care, Other (non HMO) | Admitting: Family Medicine

## 2023-03-22 VITALS — BP 125/70 | HR 56 | Temp 97.9°F | Ht 67.0 in | Wt 235.0 lb

## 2023-03-22 DIAGNOSIS — R7303 Prediabetes: Secondary | ICD-10-CM | POA: Diagnosis not present

## 2023-03-22 DIAGNOSIS — I10 Essential (primary) hypertension: Secondary | ICD-10-CM

## 2023-03-22 DIAGNOSIS — M109 Gout, unspecified: Secondary | ICD-10-CM

## 2023-03-22 DIAGNOSIS — E782 Mixed hyperlipidemia: Secondary | ICD-10-CM | POA: Diagnosis not present

## 2023-03-22 DIAGNOSIS — Z6836 Body mass index (BMI) 36.0-36.9, adult: Secondary | ICD-10-CM

## 2023-03-22 LAB — BAYER DCA HB A1C WAIVED: HB A1C (BAYER DCA - WAIVED): 6.3 % — ABNORMAL HIGH (ref 4.8–5.6)

## 2023-03-22 MED ORDER — LISINOPRIL 10 MG PO TABS
10.0000 mg | ORAL_TABLET | Freq: Every day | ORAL | 3 refills | Status: DC
Start: 2023-03-22 — End: 2024-03-22

## 2023-03-22 MED ORDER — ATORVASTATIN CALCIUM 40 MG PO TABS
40.0000 mg | ORAL_TABLET | Freq: Every day | ORAL | 1 refills | Status: DC
Start: 2023-03-22 — End: 2024-03-22

## 2023-03-22 MED ORDER — TRELEGY ELLIPTA 100-62.5-25 MCG/ACT IN AEPB: 1.0000 | INHALATION_SPRAY | Freq: Every day | RESPIRATORY_TRACT | 5 refills | Status: DC

## 2023-03-22 MED ORDER — AMLODIPINE BESYLATE 5 MG PO TABS
5.0000 mg | ORAL_TABLET | Freq: Every day | ORAL | 1 refills | Status: DC
Start: 2023-03-22 — End: 2024-03-22

## 2023-03-22 MED ORDER — ALLOPURINOL 300 MG PO TABS
300.0000 mg | ORAL_TABLET | Freq: Every day | ORAL | 1 refills | Status: DC
Start: 2023-03-22 — End: 2024-03-22

## 2023-03-22 NOTE — Progress Notes (Signed)
Subjective:  Patient ID: Edwin Taylor,  male    DOB: 1961-04-06  Age: 62 y.o.    CC: Medical Management of Chronic Issues   HPI Edwin Taylor presents for  follow-up of hypertension. Patient has no history of headache chest pain or shortness of breath or recent cough. Patient also denies symptoms of TIA such as numbness weakness lateralizing. Patient denies side effects from medication. States taking it regularly.  Patient also  in for follow-up of elevated cholesterol. Doing well without complaints on current medication. Denies side effects  including myalgia and arthralgia and nausea. Also in today for liver function testing. Currently no chest pain, shortness of breath or other cardiovascular related symptoms noted.  Follow-up of prediabetes. Patient denies symptoms such as excessive hunger or urinary frequency, excessive hunger, nausea No significant hypoglycemic spells noted. Medications reviewed. Pt reports taking them regularly. Pt. denies complication/adverse reaction today.  Pt. Is unwilling to give up Pepsi, Little Debbie's and Chips that he consumes daily. Can't understand why he can't lose weight.  Wants  a cheaper inhaler. Paying $500 for Trelegy, taking every other day without dyspnea. I reminded him we sent several to the pharmacy just 3-4 months ago and none were afordable. At least this one works at every other day dose.   History Edwin Taylor has a past medical history of Acute exacerbation of chronic obstructive pulmonary disease (COPD) ([facility]) (07/05/2017), Gout, Hernia, Hyperlipidemia, Hypertension, Obesity, and Tobacco use.   He has no past surgical history on file.   His family history includes Arthritis in his mother; Birth defects in his sister; Cancer (age of onset: 26) in his father; Dementia in his maternal grandmother; Diabetes in his father, maternal grandfather, maternal grandmother, and paternal grandmother; Heart disease in his maternal grandfather and  paternal grandmother.He reports that he has been smoking cigarettes. He has never used smokeless tobacco. He reports current alcohol use of about 18.0 standard drinks of alcohol per week. He reports that he does not use drugs.  Current Outpatient Medications on File Prior to Visit  Medication Sig Dispense Refill   albuterol (VENTOLIN HFA) 108 (90 Base) MCG/ACT inhaler INHALE 2 PUFFS BY MOUTH EVERY 4 HOURS AS NEEDED FOR WHEEZING AND SHORTNESS OF BREATH 9 g 0   No current facility-administered medications on file prior to visit.    ROS Review of Systems  Constitutional:  Positive for fatigue (No energy. Barely has enough to finish his work day.). Negative for fever.  Respiratory:  Negative for shortness of breath.   Cardiovascular:  Negative for chest pain.  Musculoskeletal:  Negative for arthralgias.  Skin:  Negative for rash.    Objective:  BP 125/70   Pulse (!) 56   Temp 97.9 F (36.6 C)   Ht 5\' 7"  (1.702 m)   Wt 235 lb (106.6 kg)   BMI 36.81 kg/m   BP Readings from Last 3 Encounters:  03/22/23 125/70  01/21/23 127/77  06/30/22 133/78    Wt Readings from Last 3 Encounters:  03/22/23 235 lb (106.6 kg)  01/21/23 226 lb 9.6 oz (102.8 kg)  06/30/22 221 lb 9.6 oz (100.5 kg)     Physical Exam Vitals reviewed.  Constitutional:      Appearance: He is well-developed. He is obese.  HENT:     Head: Normocephalic and atraumatic.     Right Ear: External ear normal.     Left Ear: External ear normal.     Mouth/Throat:     Pharynx: No  oropharyngeal exudate or posterior oropharyngeal erythema.  Eyes:     Pupils: Pupils are equal, round, and reactive to light.  Cardiovascular:     Rate and Rhythm: Normal rate and regular rhythm.     Heart sounds: No murmur heard. Pulmonary:     Effort: No respiratory distress.     Breath sounds: Normal breath sounds.  Musculoskeletal:     Cervical back: Normal range of motion and neck supple.  Neurological:     Mental Status: He is alert  and oriented to person, place, and time.     Diabetic Foot Exam - Simple   No data filed     Lab Results  Component Value Date   HGBA1C 6.3 (H) 03/22/2023   HGBA1C 6.0 (H) 06/15/2022   HGBA1C 6.1 08/30/2018    Assessment & Plan:   Edwin Taylor was seen today for medical management of chronic issues.  Diagnoses and all orders for this visit:  Pre-diabetes -     Bayer DCA Hb A1c Waived  Primary hypertension -     CBC with Differential/Platelet -     CMP14+EGFR -     amLODipine (NORVASC) 5 MG tablet; Take 1 tablet (5 mg total) by mouth daily. -     lisinopril (ZESTRIL) 10 MG tablet; Take 1 tablet (10 mg total) by mouth daily.  Mixed hyperlipidemia -     Lipid panel -     atorvastatin (LIPITOR) 40 MG tablet; Take 1 tablet (40 mg total) by mouth daily.  Gout of multiple sites, unspecified cause, unspecified chronicity -     allopurinol (ZYLOPRIM) 300 MG tablet; Take 1 tablet (300 mg total) by mouth daily. (NEEDS TO BE SEEN BEFORE NEXT REFILL) -     Uric acid  Class 2 severe obesity due to excess calories with serious comorbidity and body mass index (BMI) of 36.0 to 36.9 in adult [facility])  Other orders -     Fluticasone-Umeclidin-Vilant (TRELEGY ELLIPTA) 100-62.5-25 MCG/ACT AEPB; Inhale 1 puff into the lungs daily.   I have discontinued Homero Fellers A. Cawthorn's colchicine, Vitamin D (Ergocalciferol), fluticasone, methylPREDNISolone, and azithromycin. I am also having him maintain his albuterol, allopurinol, amLODipine, atorvastatin, Trelegy Ellipta, and lisinopril.  Meds ordered this encounter  Medications   allopurinol (ZYLOPRIM) 300 MG tablet    Sig: Take 1 tablet (300 mg total) by mouth daily. (NEEDS TO BE SEEN BEFORE NEXT REFILL)    Dispense:  90 tablet    Refill:  1   amLODipine (NORVASC) 5 MG tablet    Sig: Take 1 tablet (5 mg total) by mouth daily.    Dispense:  90 tablet    Refill:  1   atorvastatin (LIPITOR) 40 MG tablet    Sig: Take 1 tablet (40 mg total) by mouth daily.     Dispense:  90 tablet    Refill:  1   Fluticasone-Umeclidin-Vilant (TRELEGY ELLIPTA) 100-62.5-25 MCG/ACT AEPB    Sig: Inhale 1 puff into the lungs daily.    Dispense:  60 each    Refill:  5   lisinopril (ZESTRIL) 10 MG tablet    Sig: Take 1 tablet (10 mg total) by mouth daily.    Dispense:  90 tablet    Refill:  3     Follow-up: Return in about 6 months (around 09/22/2023).  Mechele Claude, M.D.

## 2023-03-23 LAB — CBC WITH DIFFERENTIAL/PLATELET
Basophils Absolute: 0.1 10*3/uL (ref 0.0–0.2)
Basos: 1 %
EOS (ABSOLUTE): 0.2 10*3/uL (ref 0.0–0.4)
Eos: 2 %
Hematocrit: 46.8 % (ref 37.5–51.0)
Hemoglobin: 15.9 g/dL (ref 13.0–17.7)
Immature Grans (Abs): 0.2 10*3/uL — ABNORMAL HIGH (ref 0.0–0.1)
Immature Granulocytes: 1 %
Lymphocytes Absolute: 2.6 10*3/uL (ref 0.7–3.1)
Lymphs: 24 %
MCH: 31.1 pg (ref 26.6–33.0)
MCHC: 34 g/dL (ref 31.5–35.7)
MCV: 92 fL (ref 79–97)
Monocytes Absolute: 0.9 10*3/uL (ref 0.1–0.9)
Monocytes: 8 %
Neutrophils Absolute: 7.1 10*3/uL — ABNORMAL HIGH (ref 1.4–7.0)
Neutrophils: 64 %
Platelets: 179 10*3/uL (ref 150–450)
RBC: 5.11 x10E6/uL (ref 4.14–5.80)
RDW: 12.5 % (ref 11.6–15.4)
WBC: 11.1 10*3/uL — ABNORMAL HIGH (ref 3.4–10.8)

## 2023-03-23 LAB — CMP14+EGFR
ALT: 11 IU/L (ref 0–44)
AST: 14 IU/L (ref 0–40)
Albumin: 4.3 g/dL (ref 3.9–4.9)
Alkaline Phosphatase: 89 IU/L (ref 44–121)
BUN/Creatinine Ratio: 7 — ABNORMAL LOW (ref 10–24)
BUN: 9 mg/dL (ref 8–27)
Bilirubin Total: 0.3 mg/dL (ref 0.0–1.2)
CO2: 26 mmol/L (ref 20–29)
Calcium: 9 mg/dL (ref 8.6–10.2)
Chloride: 103 mmol/L (ref 96–106)
Creatinine, Ser: 1.33 mg/dL — ABNORMAL HIGH (ref 0.76–1.27)
Globulin, Total: 2.3 g/dL (ref 1.5–4.5)
Glucose: 79 mg/dL (ref 70–99)
Potassium: 4.6 mmol/L (ref 3.5–5.2)
Sodium: 142 mmol/L (ref 134–144)
Total Protein: 6.6 g/dL (ref 6.0–8.5)
eGFR: 60 mL/min/{1.73_m2} (ref >59)

## 2023-03-23 LAB — LIPID PANEL
Chol/HDL Ratio: 3.1 ratio (ref 0.0–5.0)
Cholesterol, Total: 122 mg/dL (ref 100–199)
HDL: 39 mg/dL — ABNORMAL LOW (ref >39)
LDL Chol Calc (NIH): 57 mg/dL (ref 0–99)
Triglycerides: 150 mg/dL — ABNORMAL HIGH (ref 0–149)
VLDL Cholesterol Cal: 26 mg/dL (ref 5–40)

## 2023-03-23 NOTE — Progress Notes (Signed)
Hello Everet,  Your lab result is normal and/or stable.Some minor variations that are not significant are commonly marked abnormal, but do not represent any medical problem for you.  Best regards, Warren Stacks, M.D.

## 2023-03-25 LAB — URIC ACID: Uric Acid: 7 mg/dL (ref 3.8–8.4)

## 2023-03-25 LAB — SPECIMEN STATUS REPORT

## 2023-03-25 NOTE — Progress Notes (Signed)
Hello Everet,  Your lab result is normal and/or stable.Some minor variations that are not significant are commonly marked abnormal, but do not represent any medical problem for you.  Best regards, Warren Stacks, M.D.

## 2023-04-03 ENCOUNTER — Other Ambulatory Visit: Payer: Self-pay | Admitting: Family Medicine

## 2023-04-03 DIAGNOSIS — J449 Chronic obstructive pulmonary disease, unspecified: Secondary | ICD-10-CM

## 2023-04-04 ENCOUNTER — Other Ambulatory Visit: Payer: Self-pay | Admitting: Family Medicine

## 2023-04-04 DIAGNOSIS — J449 Chronic obstructive pulmonary disease, unspecified: Secondary | ICD-10-CM

## 2023-04-27 ENCOUNTER — Other Ambulatory Visit: Payer: Self-pay | Admitting: Family Medicine

## 2023-04-27 DIAGNOSIS — J449 Chronic obstructive pulmonary disease, unspecified: Secondary | ICD-10-CM

## 2023-07-19 ENCOUNTER — Other Ambulatory Visit: Payer: Self-pay | Admitting: Family Medicine

## 2023-07-19 ENCOUNTER — Telehealth: Payer: Self-pay | Admitting: *Deleted

## 2023-07-19 MED ORDER — BUDESONIDE-FORMOTEROL FUMARATE 160-4.5 MCG/ACT IN AERO: 2.0000 | INHALATION_SPRAY | Freq: Two times a day (BID) | RESPIRATORY_TRACT | 5 refills | Status: DC

## 2023-07-19 NOTE — Telephone Encounter (Signed)
Fax from [facility] RE: Trelegy Note: pt request new Rx Trelegy costing over $500 on coupon (ins isn't covering) pt asking to see if something else could be ordered in it's place Please advise

## 2023-07-19 NOTE — Telephone Encounter (Signed)
Please let the patient know that I sent their prescription to their pharmacy. Thanks, WS 

## 2023-07-19 NOTE — Telephone Encounter (Signed)
Patient aware.

## 2023-07-21 ENCOUNTER — Other Ambulatory Visit: Payer: Self-pay | Admitting: *Deleted

## 2023-07-21 DIAGNOSIS — J449 Chronic obstructive pulmonary disease, unspecified: Secondary | ICD-10-CM

## 2023-07-21 MED ORDER — ALBUTEROL SULFATE HFA 108 (90 BASE) MCG/ACT IN AERS: INHALATION_SPRAY | RESPIRATORY_TRACT | 2 refills | Status: DC

## 2023-07-21 NOTE — Telephone Encounter (Signed)
PRESCRIPTION SENT AS REQUESTED 

## 2023-07-21 NOTE — Telephone Encounter (Signed)
Copied from CRM [id number]. Topic: Clinical - Medication Refill >> Jul 21, 2023  4:27 PM Jorje Guild R wrote: Most Recent Primary Care Visit:  Provider: Mechele Claude  Department: [facility]  Visit Type: OFFICE VISIT  Date: 03/22/2023  Medication: Albuterol Inhaler  Has the patient contacted their pharmacy? Yes (Agent: If no, request that the patient contact the pharmacy for the refill. If patient does not wish to contact the pharmacy document the reason why and proceed with request.) (Agent: If yes, when and what did the pharmacy advise?)  Is this the correct pharmacy for this prescription? Yes If no, delete pharmacy and type the correct one.  This is the patient's preferred pharmacy:  [facility] [address], [place] - [address] [place] [address] Phone: [phone] Fax: [phone]  [id number] [place], [place] - [address] [place] [address] Phone: [phone] Fax: [phone]   Has the prescription been filled recently? Yes  Is the patient out of the medication? Yes  Has the patient been seen for an appointment in the last year OR does the patient have an upcoming appointment? Yes  Can we respond through MyChart? Yes  Agent: Please be advised that Rx refills may take up to 3 business days. We ask that you follow-up with your pharmacy.

## 2023-07-23 MED ORDER — BUDESONIDE-FORMOTEROL FUMARATE 160-4.5 MCG/ACT IN AERO: 2.0000 | INHALATION_SPRAY | Freq: Two times a day (BID) | RESPIRATORY_TRACT | 5 refills | Status: DC

## 2023-07-23 NOTE — Addendum Note (Signed)
Addended by: Julious Payer D on: 07/23/2023 10:21 AM   Modules accepted: Orders

## 2023-07-23 NOTE — Telephone Encounter (Signed)
[facility] script not received, resent.

## 2023-09-22 ENCOUNTER — Ambulatory Visit: Payer: Managed Care, Other (non HMO) | Admitting: Family Medicine

## 2023-09-23 ENCOUNTER — Ambulatory Visit: Payer: 59 | Admitting: Family Medicine

## 2023-09-23 ENCOUNTER — Encounter: Payer: Self-pay | Admitting: Family Medicine

## 2023-09-23 VITALS — BP 118/61 | HR 72 | Temp 98.4°F | Ht 67.0 in | Wt 217.4 lb

## 2023-09-23 DIAGNOSIS — I10 Essential (primary) hypertension: Secondary | ICD-10-CM

## 2023-09-23 DIAGNOSIS — R7303 Prediabetes: Secondary | ICD-10-CM | POA: Diagnosis not present

## 2023-09-23 DIAGNOSIS — E782 Mixed hyperlipidemia: Secondary | ICD-10-CM | POA: Diagnosis not present

## 2023-09-23 DIAGNOSIS — J449 Chronic obstructive pulmonary disease, unspecified: Secondary | ICD-10-CM

## 2023-09-23 LAB — BAYER DCA HB A1C WAIVED: HB A1C (BAYER DCA - WAIVED): 5.8 % — ABNORMAL HIGH (ref 4.8–5.6)

## 2023-09-23 MED ORDER — SILDENAFIL CITRATE 20 MG PO TABS: ORAL_TABLET | ORAL | 5 refills | Status: DC

## 2023-09-23 NOTE — Progress Notes (Signed)
 Subjective:  Patient ID: Edwin Taylor, male    DOB: Feb 03, 1961  Age: 63 y.o. MRN: [id number]  CC: Medical Management of Chronic Issues (6 month)   HPI Edwin Taylor presents for COPD. Breathing better. Using the symbicort regularly.    presents for  follow-up of hypertension. Patient has no history of headache chest pain or shortness of breath or recent cough. Patient also denies symptoms of TIA such as focal numbness or weakness. Patient denies side effects from medication. States taking it regularly.   in for follow-up of elevated cholesterol. Doing well without complaints on current medication. Denies side effects of statin including myalgia and arthralgia and nausea. Currently no chest pain, shortness of breath or other cardiovascular related symptoms noted.  Having E.D. wants to date. Wife died last 20-Jun-2024.      04/06/23    4:04 PM 06-Apr-2023    4:01 PM 06/30/2022    4:01 PM  Depression screen PHQ 2/9  Decreased Interest 3 0 0  Down, Depressed, Hopeless 0 0 0  PHQ - 2 Score 3 0 0  Altered sleeping 0  0  Tired, decreased energy 3  0  Change in appetite 1  0  Feeling bad or failure about yourself  0  0  Trouble concentrating 0  0  Moving slowly or fidgety/restless 0  0  Suicidal thoughts 0  0  PHQ-9 Score 7  0  Difficult doing work/chores Somewhat difficult  Not difficult at all    History Edwin Taylor has a past medical history of Acute exacerbation of chronic obstructive pulmonary disease (COPD) (HCC) (07/05/2017), Gout, Hernia, Hyperlipidemia, Hypertension, Obesity, and Tobacco use.   He has no past surgical history on file.   His family history includes Arthritis in his mother; Birth defects in his sister; Cancer (age of onset: 94) in his father; Dementia in his maternal grandmother; Diabetes in his father, maternal grandfather, maternal grandmother, and paternal grandmother; Heart disease in his maternal grandfather and paternal grandmother.He reports that he has  been smoking cigarettes. He has never used smokeless tobacco. He reports current alcohol use of about 18.0 standard drinks of alcohol per week. He reports that he does not use drugs.    ROS Review of Systems  Constitutional:  Negative for fever.  Respiratory:  Negative for shortness of breath.   Cardiovascular:  Negative for chest pain.  Musculoskeletal:  Negative for arthralgias.  Skin:  Negative for rash.    Objective:  BP 118/61   Pulse 72   Temp 98.4 F (36.9 C) (Temporal)   Ht 5\' 7"  (1.702 m)   Wt 217 lb 6.4 oz (98.6 kg)   SpO2 94%   BMI 34.05 kg/m   BP Readings from Last 3 Encounters:  09/23/23 118/61  06-Apr-2023 125/70  01/21/23 127/77    Wt Readings from Last 3 Encounters:  09/23/23 217 lb 6.4 oz (98.6 kg)  04/06/2023 235 lb (106.6 kg)  01/21/23 226 lb 9.6 oz (102.8 kg)     Physical Exam Vitals reviewed.  Constitutional:      Appearance: He is well-developed.  HENT:     Head: Normocephalic and atraumatic.     Right Ear: External ear normal.     Left Ear: External ear normal.     Mouth/Throat:     Pharynx: No oropharyngeal exudate or posterior oropharyngeal erythema.  Eyes:     Pupils: Pupils are equal, round, and reactive to light.  Cardiovascular:     Rate and Rhythm:  Normal rate and regular rhythm.     Heart sounds: No murmur heard. Pulmonary:     Effort: No respiratory distress.     Breath sounds: Normal breath sounds.  Musculoskeletal:     Cervical back: Normal range of motion and neck supple.  Neurological:     Mental Status: He is alert and oriented to person, place, and time.       Assessment & Plan:   Edwin Taylor was seen today for medical management of chronic issues.  Diagnoses and all orders for this visit:  Pre-diabetes -     Bayer DCA Hb A1c Waived -     CBC with Differential/Platelet -     CMP14+EGFR  Primary hypertension -     CBC with Differential/Platelet -     CMP14+EGFR  Mixed hyperlipidemia -     CBC with  Differential/Platelet -     CMP14+EGFR -     Lipid panel  COPD mixed type (HCC)  Other orders -     sildenafil (REVATIO) 20 MG tablet; Take 2-5 pills at once, orally, with each sexual encounter       I am having Edwin Taylor start on sildenafil. I am also having him maintain his allopurinol, amLODipine, atorvastatin, lisinopril, albuterol, and budesonide-formoterol.  Allergies as of 09/23/2023   No Known Allergies      Medication List        Accurate as of September 23, 2023 11:59 PM. If you have any questions, ask your nurse or doctor.          albuterol 108 (90 Base) MCG/ACT inhaler Commonly known as: VENTOLIN HFA INHALE 2 PUFFS BY MOUTH EVERY 4 HOURS AS NEEDED FOR WHEEZING AND SHORTNESS OF BREATH   allopurinol 300 MG tablet Commonly known as: ZYLOPRIM Take 1 tablet (300 mg total) by mouth daily. (NEEDS TO BE SEEN BEFORE NEXT REFILL)   amLODipine 5 MG tablet Commonly known as: NORVASC Take 1 tablet (5 mg total) by mouth daily.   atorvastatin 40 MG tablet Commonly known as: LIPITOR Take 1 tablet (40 mg total) by mouth daily.   budesonide-formoterol 160-4.5 MCG/ACT inhaler Commonly known as: SYMBICORT Inhale 2 puffs into the lungs 2 (two) times daily.   lisinopril 10 MG tablet Commonly known as: ZESTRIL Take 1 tablet (10 mg total) by mouth daily.   sildenafil 20 MG tablet Commonly known as: REVATIO Take 2-5 pills at once, orally, with each sexual encounter Started by: Broadus John Timohty Renbarger         Follow-up: Return in about 6 months (around 03/22/2024).  Mechele Claude, M.D.

## 2023-09-26 ENCOUNTER — Encounter: Payer: Self-pay | Admitting: Family Medicine

## 2023-10-22 ENCOUNTER — Other Ambulatory Visit: Payer: Self-pay | Admitting: Family Medicine

## 2023-10-22 DIAGNOSIS — J449 Chronic obstructive pulmonary disease, unspecified: Secondary | ICD-10-CM

## 2024-03-22 ENCOUNTER — Other Ambulatory Visit: Payer: Self-pay | Admitting: Family Medicine

## 2024-03-22 ENCOUNTER — Encounter: Payer: Self-pay | Admitting: Family Medicine

## 2024-03-22 ENCOUNTER — Ambulatory Visit: Payer: 59 | Admitting: Family Medicine

## 2024-03-22 VITALS — BP 136/81 | HR 63 | Temp 98.0°F | Ht 67.0 in | Wt 215.6 lb

## 2024-03-22 DIAGNOSIS — I10 Essential (primary) hypertension: Secondary | ICD-10-CM | POA: Diagnosis not present

## 2024-03-22 DIAGNOSIS — M109 Gout, unspecified: Secondary | ICD-10-CM

## 2024-03-22 DIAGNOSIS — R7303 Prediabetes: Secondary | ICD-10-CM

## 2024-03-22 DIAGNOSIS — F17210 Nicotine dependence, cigarettes, uncomplicated: Secondary | ICD-10-CM

## 2024-03-22 DIAGNOSIS — E782 Mixed hyperlipidemia: Secondary | ICD-10-CM | POA: Diagnosis not present

## 2024-03-22 DIAGNOSIS — J449 Chronic obstructive pulmonary disease, unspecified: Secondary | ICD-10-CM | POA: Diagnosis not present

## 2024-03-22 MED ORDER — SILDENAFIL CITRATE 20 MG PO TABS: ORAL_TABLET | ORAL | 5 refills | Status: AC

## 2024-03-22 MED ORDER — LISINOPRIL 10 MG PO TABS
10.0000 mg | ORAL_TABLET | Freq: Every day | ORAL | 3 refills | Status: AC
Start: 2024-03-22 — End: ?

## 2024-03-22 MED ORDER — ALBUTEROL SULFATE HFA 108 (90 BASE) MCG/ACT IN AERS
INHALATION_SPRAY | RESPIRATORY_TRACT | 3 refills | Status: AC
Start: 2024-03-22 — End: ?

## 2024-03-22 MED ORDER — ATORVASTATIN CALCIUM 40 MG PO TABS: 40.0000 mg | ORAL_TABLET | Freq: Every day | ORAL | 3 refills | Status: AC

## 2024-03-22 MED ORDER — BUDESONIDE-FORMOTEROL FUMARATE 160-4.5 MCG/ACT IN AERO: 2.0000 | INHALATION_SPRAY | Freq: Two times a day (BID) | RESPIRATORY_TRACT | 5 refills | Status: DC

## 2024-03-22 MED ORDER — ALLOPURINOL 300 MG PO TABS
300.0000 mg | ORAL_TABLET | Freq: Every day | ORAL | 3 refills | Status: AC
Start: 2024-03-22 — End: ?

## 2024-03-22 MED ORDER — AMLODIPINE BESYLATE 5 MG PO TABS
5.0000 mg | ORAL_TABLET | Freq: Every day | ORAL | 3 refills | Status: AC
Start: 2024-03-22 — End: ?

## 2024-03-22 MED ORDER — TERBINAFINE HCL 250 MG PO TABS: 250.0000 mg | ORAL_TABLET | Freq: Every day | ORAL | 0 refills | Status: AC

## 2024-03-26 NOTE — Progress Notes (Signed)
 Subjective:  Patient ID: Edwin Taylor, male    DOB: August 14, 1960  Age: 63 y.o. MRN: [id number]  CC: 6 MONTH FOLLOW UP   HPI  Discussed the use of AI scribe software for clinical note transcription with the patient, who gave verbal consent to proceed.  History of Present Illness   Edwin Taylor is a 63 year old male who presents for follow-up of his chronic conditions.  He has been dealing with toenail fungus for six months, using an over-the-counter treatment described as a nail polish-like application, with no improvement. He is considering switching to a prescription strength treatment due to the lack of noticeable change.  He has COPD and uses a Symbicort  inhaler as prescribed, with two puffs in the morning and two in the afternoon. He also uses a rescue inhaler two to three times a day, especially in hot and humid conditions. He experiences frequent coughing, particularly in the morning, producing yellow, sticky sputum. He mentions difficulty affording certain medications, such as Trelegy, which is not covered by his insurance. He denies shortness of breath beyond his usual level.  He is on medication for blood pressure and cholesterol, which he refills regularly. He denies needing medication for intimacy issues.  He continues to smoke and drink, expressing a lack of concern for the health implications. He mentions a family history of limited longevity, with men not living much past seventy without medical interventions like pacemakers.  He shares a detailed account of his wife's recent death, attributing it to medical mismanagement during her cancer treatment. He describes her experience with blood clots and the subsequent lack of effective intervention, leading to her passing. This experience has influenced his views on medical treatments, particularly chemotherapy.              03/22/2024    3:38 PM 03/22/2023    4:04 PM 03/22/2023    4:01 PM  Depression screen PHQ 2/9   Decreased Interest 0 3 0  Down, Depressed, Hopeless 0 0 0  PHQ - 2 Score 0 3 0  Altered sleeping  0   Tired, decreased energy  3   Change in appetite  1   Feeling bad or failure about yourself   0   Trouble concentrating  0   Moving slowly or fidgety/restless  0   Suicidal thoughts  0   PHQ-9 Score  7   Difficult doing work/chores  Somewhat difficult     History Edwin Taylor has a past medical history of Acute exacerbation of chronic obstructive pulmonary disease (COPD) (HCC) (07/05/2017), Gout, Hernia, Hyperlipidemia, Hypertension, Obesity, and Tobacco use.   He has no past surgical history on file.   His family history includes Arthritis in his mother; Birth defects in his sister; Cancer (age of onset: 10) in his father; Dementia in his maternal grandmother; Diabetes in his father, maternal grandfather, maternal grandmother, and paternal grandmother; Heart disease in his maternal grandfather and paternal grandmother.He reports that he has been smoking cigarettes. He has never used smokeless tobacco. He reports current alcohol use of about 18.0 standard drinks of alcohol per week. He reports that he does not use drugs.    ROS Review of Systems  Constitutional:  Negative for fever.  Respiratory:  Negative for shortness of breath.   Cardiovascular:  Negative for chest pain.  Musculoskeletal:  Negative for arthralgias.  Skin:  Negative for rash.       Nails with discoloration , black each 2nd toenail  Objective:  BP 136/81   Pulse 63   Temp 98 F (36.7 C)   Ht 5' 7 (1.702 m)   Wt 215 lb 9.6 oz (97.8 kg)   SpO2 93%   BMI 33.77 kg/m   BP Readings from Last 3 Encounters:  03/22/24 136/81  09/23/23 118/61  03/22/23 125/70    Wt Readings from Last 3 Encounters:  03/22/24 215 lb 9.6 oz (97.8 kg)  09/23/23 217 lb 6.4 oz (98.6 kg)  03/22/23 235 lb (106.6 kg)     Physical Exam Vitals reviewed.  Constitutional:      Appearance: He is well-developed.  HENT:     Head:  Normocephalic and atraumatic.     Right Ear: External ear normal.     Left Ear: External ear normal.     Mouth/Throat:     Pharynx: No oropharyngeal exudate or posterior oropharyngeal erythema.  Eyes:     Pupils: Pupils are equal, round, and reactive to light.  Cardiovascular:     Rate and Rhythm: Normal rate and regular rhythm.     Heart sounds: No murmur heard. Pulmonary:     Effort: No respiratory distress.     Breath sounds: Normal breath sounds.  Musculoskeletal:     Cervical back: Normal range of motion and neck supple.  Neurological:     Mental Status: He is alert and oriented to person, place, and time.      Assessment & Plan:  Pre-diabetes -     Bayer DCA Hb A1c Waived  Primary hypertension -     Lipid panel -     amLODIPine  Besylate; Take 1 tablet (5 mg total) by mouth daily.  Dispense: 90 tablet; Refill: 3 -     Lisinopril ; Take 1 tablet (10 mg total) by mouth daily.  Dispense: 90 tablet; Refill: 3  Mixed hyperlipidemia -     CBC with Differential/Platelet -     CMP14+EGFR -     Atorvastatin  Calcium ; Take 1 tablet (40 mg total) by mouth daily.  Dispense: 90 tablet; Refill: 3  COPD mixed type (HCC) -     Albuterol  Sulfate HFA; INHALE 2 PUFFS BY MOUTH EVERY 4 HOURS AS NEEDED FOR WHEEZING AND FOR SHORTNESS OF BREATH  Dispense: 9 g; Refill: 3  Gout of multiple sites, unspecified cause, unspecified chronicity -     Allopurinol ; Take 1 tablet (300 mg total) by mouth daily. (NEEDS TO BE SEEN BEFORE NEXT REFILL)  Dispense: 90 tablet; Refill: 3  Smoking greater than 20 pack years -     Ambulatory Referral for Lung Cancer Scre  Other orders -     Budesonide -Formoterol  Fumarate; Inhale 2 puffs into the lungs 2 (two) times daily.  Dispense: 1 each; Refill: 5 -     Sildenafil  Citrate; Take 2-5 pills at once, orally, with each sexual encounter  Dispense: 50 tablet; Refill: 5 -     Terbinafine  HCl; Take 1 tablet (250 mg total) by mouth daily.  Dispense: 90 tablet; Refill:  0    Assessment and Plan    Onychomycosis (toenail fungus) Chronic onychomycosis unresponsive to OTC treatment for six months. - Prescribe Lamisil  (terbinafine ) for three months.  Chronic obstructive pulmonary disease (COPD) with chronic productive cough Chronic COPD with productive cough exacerbated by environmental factors. - Continue current inhaler regimen with Symbicort  and budesonide . - Consider adding Spiriva  if affordable. - Discuss potential referral to a lung specialist.  Tobacco use disorder Continued tobacco use despite known risks and previous discussions. -  Discuss risks of continued smoking and potential benefits of cessation.  Hypertension Hypertension well-controlled with current medication regimen. - Continue current antihypertensive medication. - Refill blood pressure medication.  Hyperlipidemia - Continue current lipid-lowering medication. - Refill cholesterol medication.  Gout - Continue current gout medication. - Refill gout medication.           Follow-up: Return in about 6 months (around 09/22/2024) for [facility] physical.  Butler Der, M.D.

## 2024-06-01 ENCOUNTER — Other Ambulatory Visit: Payer: Self-pay | Admitting: Family Medicine

## 2024-07-04 ENCOUNTER — Ambulatory Visit: Admitting: Family Medicine

## 2024-07-04 VITALS — BP 156/72 | HR 67 | Temp 97.8°F | Ht 67.0 in | Wt 215.0 lb

## 2024-07-04 DIAGNOSIS — I10 Essential (primary) hypertension: Secondary | ICD-10-CM

## 2024-07-04 DIAGNOSIS — R030 Elevated blood-pressure reading, without diagnosis of hypertension: Secondary | ICD-10-CM

## 2024-07-04 DIAGNOSIS — M25522 Pain in left elbow: Secondary | ICD-10-CM

## 2024-07-04 NOTE — Progress Notes (Signed)
 Acute Office Visit  Patient ID: Anubis Fundora, male    DOB: 09/24/1960, 63 y.o.   MRN: [id number]  PCP: Zollie Lowers, MD  Chief Complaint  Patient presents with   Elbow Injury    Elbow pain for the past 2 months since hitting his left elbow on a piece of steel. Patient reports pain is constant. Patient says OTC pain medicine and his elbow brace does not help.    Subjective:     HPI  Discussed the use of AI scribe software for clinical note transcription with the patient, who gave verbal consent to proceed.  History of Present Illness   Gus Littler is a 63 year old male who presents today for CC of L elbow pain.  Arm pain and functional limitation - Persistent arm pain for two months following trauma from hitting arm on a 1/4 metal plate at work - Pain severity prevents lifting heavy objects - No improvement in symptoms despite use of compression bands and a strap for six weeks - Has tried NSAIDs with no improvement in symptoms - Grip strength remains unchanged - Pain significantly impacts ability to work - Symptoms have not improved.     Elevated BP today - Patient reports that he has not been taking his BP medication because he does not like taking pills.   ROS     Objective:    BP (!) 156/72   Pulse 67   Temp 97.8 F (36.6 C)   Ht 5' 7 (1.702 m)   Wt 215 lb (97.5 kg)   SpO2 94%   BMI 33.67 kg/m    Physical Exam Vitals reviewed.  Constitutional:      Appearance: Normal appearance.  HENT:     Head: Normocephalic and atraumatic.  Eyes:     Extraocular Movements: Extraocular movements intact.     Conjunctiva/sclera: Conjunctivae normal.     Pupils: Pupils are equal, round, and reactive to light.  Cardiovascular:     Rate and Rhythm: Normal rate and regular rhythm.     Pulses: Normal pulses.     Heart sounds: Normal heart sounds. No murmur heard. Pulmonary:     Effort: Pulmonary effort is normal. No respiratory distress.     Breath sounds:  Normal breath sounds.  Musculoskeletal:        General: Swelling and tenderness present. Normal range of motion.     Cervical back: Normal range of motion.     Comments: Swelling and mobile mass/nodule palpated to the lateral L elbow. No erythema or increased warmth.   Skin:    General: Skin is warm and dry.     Capillary Refill: Capillary refill takes less than 2 seconds.  Neurological:     General: No focal deficit present.     Mental Status: He is alert and oriented to person, place, and time.  Psychiatric:        Mood and Affect: Mood normal.        Behavior: Behavior normal.       No results found for any visits on 07/04/24.     Assessment & Plan:   Problem List Items Addressed This Visit   None Visit Diagnoses       Left elbow pain    -  Primary   Relevant Orders   AMB referral to orthopedics     Elevated blood pressure reading         Essential hypertension  Assessment and Plan    L elbow pain after trauma Persistent pain for two months post-trauma, unresponsive to NSAIDs and compression. - Referred to orthopedics for evaluation and management due to lack of improvement in symptoms over the past 2 months despite NSAIDs and compression. Patient agreeable with this plan.     Hypertension and elevated BP reading today - Discussed importance of consistently taking BP medication. Discussed possible serious side effects of not taking medication. Patient voiced understanding and plans to take medication as ordered. F/u with PCP as scheduled by them.    No orders of the defined types were placed in this encounter.   Return if symptoms worsen or fail to improve.  Oneil LELON Severin, FNP [facility]

## 2024-09-21 ENCOUNTER — Encounter: Payer: Self-pay | Admitting: Family Medicine
